# Patient Record
Sex: Female | Born: 1960 | Race: White | Hispanic: No | Smoking: Never smoker
Health system: Southern US, Community
[De-identification: ages and names within clinical notes are randomized; demographics above are authoritative.]

## PROBLEM LIST (undated history)

## (undated) DIAGNOSIS — R7303 Prediabetes: Secondary | ICD-10-CM

## (undated) HISTORY — PX: TUBAL LIGATION: SHX77

## (undated) HISTORY — PX: ABDOMINAL HYSTERECTOMY: SHX81

## (undated) HISTORY — DX: Prediabetes: R73.03

## (undated) HISTORY — PX: COLONOSCOPY: SHX174

## (undated) HISTORY — PX: FINGER SURGERY: SHX640

---

## 2000-07-27 ENCOUNTER — Other Ambulatory Visit: Admission: RE | Admit: 2000-07-27 | Discharge: 2000-07-27 | Payer: Self-pay | Admitting: Obstetrics and Gynecology

## 2000-08-03 ENCOUNTER — Other Ambulatory Visit: Admission: RE | Admit: 2000-08-03 | Discharge: 2000-08-03 | Payer: Self-pay | Admitting: Obstetrics and Gynecology

## 2000-08-03 ENCOUNTER — Encounter (INDEPENDENT_AMBULATORY_CARE_PROVIDER_SITE_OTHER): Payer: Self-pay | Admitting: Specialist

## 2000-09-03 ENCOUNTER — Encounter (INDEPENDENT_AMBULATORY_CARE_PROVIDER_SITE_OTHER): Payer: Self-pay | Admitting: Specialist

## 2000-09-03 ENCOUNTER — Ambulatory Visit (HOSPITAL_COMMUNITY): Admission: RE | Admit: 2000-09-03 | Discharge: 2000-09-03 | Payer: Self-pay | Admitting: Obstetrics and Gynecology

## 2000-10-21 ENCOUNTER — Encounter (INDEPENDENT_AMBULATORY_CARE_PROVIDER_SITE_OTHER): Payer: Self-pay | Admitting: Specialist

## 2000-10-21 ENCOUNTER — Inpatient Hospital Stay (HOSPITAL_COMMUNITY): Admission: RE | Admit: 2000-10-21 | Discharge: 2000-10-23 | Payer: Self-pay | Admitting: Obstetrics and Gynecology

## 2000-11-30 ENCOUNTER — Other Ambulatory Visit: Admission: RE | Admit: 2000-11-30 | Discharge: 2000-11-30 | Payer: Self-pay | Admitting: Obstetrics and Gynecology

## 2001-03-01 ENCOUNTER — Other Ambulatory Visit: Admission: RE | Admit: 2001-03-01 | Discharge: 2001-03-01 | Payer: Self-pay | Admitting: Internal Medicine

## 2001-05-30 ENCOUNTER — Other Ambulatory Visit: Admission: RE | Admit: 2001-05-30 | Discharge: 2001-05-30 | Payer: Self-pay | Admitting: Obstetrics and Gynecology

## 2001-08-29 ENCOUNTER — Other Ambulatory Visit: Admission: RE | Admit: 2001-08-29 | Discharge: 2001-08-29 | Payer: Self-pay | Admitting: Internal Medicine

## 2001-11-29 ENCOUNTER — Other Ambulatory Visit: Admission: RE | Admit: 2001-11-29 | Discharge: 2001-11-29 | Payer: Self-pay | Admitting: Obstetrics and Gynecology

## 2004-07-17 ENCOUNTER — Encounter: Admission: RE | Admit: 2004-07-17 | Discharge: 2004-07-17 | Payer: Self-pay | Admitting: Obstetrics and Gynecology

## 2010-04-08 ENCOUNTER — Encounter: Admission: RE | Admit: 2010-04-08 | Discharge: 2010-04-08 | Payer: Self-pay | Admitting: Family Medicine

## 2010-11-21 NOTE — Op Note (Signed)
[facility]  Patient:    Joanna Santos, Joanna Santos [facility]                     MRN: [id number] Proc. Date: 10/21/00 Adm. 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  OPERATION PERFORMED:  Vaginal hysterectomy accomplished only by coring technique.  SURGEON:  Dr. Ambrose Mantle.  ASSISTANT:  Dr. Jackelyn Knife.  ANESTHESIA:  General.  DESCRIPTION OF PROCEDURE:  The patient was brought to the operating room and placed under satisfactory general anesthesia. She was placed in lithotomy position. The exam revealed the uterus to be not very mobile. I did feel like I could feel the cul-de-sac. The uterus was hard to feel. It was probably mid plane and I thought it was normal size. The adnexa seemed clear. The vulva, vagina, perineum and urethra were prepped with Betadine solution. A Foley catheter was inserted to straight drain and the area was draped as a sterile field. The cervix did not move much but it was grasped with Allen clamps. The cervicovaginal junction was injected with a dilute solution of Neo-Synephrine and then a circumferential incision was made around the cervix. I tried to push the anterior mucosa forward. I could not identify the cul-de-sac with certainty so I took a couple of bites extraperitoneal and then did a rectal exam to try to ensure that the rectum was not involved with what I was going to enter in the cul-de-sac. I then entered the cul-de-sac without injuring the rectum. Clamps were placed on the uterosacral ligaments bilaterally. They were cut, suture ligated and held. The paracervical and parametrial tissues were then clamped, cut and suture ligated in a step wise fashion. The  uterus still did not move much. I was able to enter the peritoneal cavity anteriorly, continued to go up the sides of the uterus, tried a couple of times to invert the uterus through the incision and the cul-de-sac but it would not move. Finally I elected to core the uterus and after coring the uterus, I was able to approach both upper pedicles. At this point, however, it was apparent that the clamps across what I had assumed were the upper pedicles on the right side basically were holding no tissue. There was some bleeding. I went ahead and removed the uterus by transecting the left upper pedicle and doubly suture ligating it. I searched for the bleeding points on the right and they seemed to be bleeding points just next to the ovary. I was able to control the bleeding with figure-of-eight sutures of #0 Vicryl. I then held on to the sutures on the upper pedicles on the right as well as the left. It took me a good while to establish complete hemostasis along the broad ligaments. I then filled the bladder with methylene blue stained fluid, there was no leakage. I reperitonealized the pelvis using a running suture of #1 Vicryl through the anterior peritoneum, the upper pedicles and lower pedicles bilaterally as well as the posterior cul-de-sac. I then tied this suture down after confirming that hemostasis intraperitoneal was complete. I then reapproximated the vaginal mucosa with interrupted figure-of-eight suture of #0 Vicryl.  Hemostasis appeared to be complete. I would say that this is one of the most difficult vaginal hysterectomies that I have ever performed not only requiring the coring technique but getting proper lighting into the deep vagina as well as being able to place the sutures so high in the vagina was a continual challenge. Blood loss was thought to be about 400 cc. Sponge and needle counts were correct and the patient was returned to recovery in satisfactory condition. DD:   10/21/00 TD:  10/21/00 Job: [id number]

## 2010-11-21 NOTE — Op Note (Signed)
[facility]  Patient:    Joanna Santos, Joanna Santos                       MRN: [id number] Proc. Date: 09/03/00 Adm. Date:  [id number] Disc. Date: [id number] Attending:  Malon Kindle                           Operative Report  PREOPERATIVE DIAGNOSIS:  Severe cervical dysplasia with unsatisfactory colposcopy.  POSTOPERATIVE DIAGNOSIS:  Severe cervical dysplasia with unsatisfactory colposcopy.  OPERATION:  Dilation and curettage laser cone.  OPERATOR:  Malachi Pro. Ambrose Mantle, M.D.  ANESTHESIA:  General.  DESCRIPTION OF PROCEDURE:  The patient was brought to the operating room and placed under satisfactory general anesthesia, placed in lithotomy position. Examination revealed the uterus to be anterior and normal size.  The adnexa were clean.  The patient was on her menstrual period.  The vulva was prepped with Betadine solution and draped as a sterile field.  Wet towels were placed around the vulva.  The Graves speculum was inserted.  It was ebonized.  The cervix was identified, was painted with 5% acetic acid.   There was white epithelium with some vessels at approximately 10-12 oclock.  I could not see the squamocolumnar junction well, but there was some mucus present which might have inhibited the visualization.  I outlined a cone bed and then injected the cervix with a dilute solution of Pitressin.  I used about 15 cc of a solution mixed by using 10 units of Pitressin and 120 cc of saline.  I then did the conization with a superpulse at maximum wattage of 13.  I cut off the top of the cone with sharp scissors, obtained hemostasis with a defocused beam at 10 watts, then did a fractional D&C and reapproximated the cervix with four suture of 0 chromic catgut.  The patient seemed to tolerate the procedure well.  Blood loss was less than 5 cc.  I did resound the uterus at the end of the procedure to confirm the patency of the cervical canal, and I placed  a little bit of Munsell solution in the cervical canal. DD:  09/03/00 TD:  09/05/00 Job: [id number]

## 2010-11-21 NOTE — Discharge Summary (Signed)
[facility]  Patient:    Joanna Santos, Joanna Santos [facility]                     MRN: [id number] Adm. Date:  [id number] Disc. Date: [id number] Attending:  Malon Kindle                           Discharge Summary  HISTORY OF PRESENT ILLNESS:  This is a 50 year old white female who had stage IA 1 squamous cell carcinoma of the cervix on a recent conization.  She was admitted for vaginal hysterectomy.  HOSPITAL COURSE:  The patient underwent a vaginal hysterectomy by coring technique by Dr. Ambrose Mantle with Dr. Jackelyn Knife assisting under general anesthesia on October 21, 2000.  Blood loss about 400 cc.  One pack was left.  The pack was removed on the first postop day.  The patient did quite well, and she was discharged on the second postop day.  Her abdomen was soft, nontender.  She was passing flatus, tolerating a diet.  LABORATORY DATA:  Initial hemoglobin was 14.4, hematocrit 41.5, white count 6100, platelet count 297,000.  Comprehensive metabolic profile was normal. Urinalysis was negative.  Follow-up hematocrit was 37.7.  Path report showed uterus and cervix.  Cervix showed findings consistent with previous conization, no residual dysplasia with microinvasive carcinoma was identified, benign secretory endometrium, no hyperplasia or malignancy, adenomyosis, leiomyomata with focal increased mitotic figures.  One of the leiomyoma showed increased numbers of mitotic activity with a mitotic count ranging from 1 to 4 per 10 high-power fields.  This finding is not associated with significant atypia and infiltrative growth pattern or coagulative necrosis, and the features are consistent with a mitotically active leiomyoma.  Additional sections of the leiomyomas will be submitted and reported as an addendum.  The addendum has not been reported at this point.  FINAL DIAGNOSES: 1. Squamous cell carcinoma of the cervix, stage IA 1, with no residual    dysplasia or carcinoma  identified in the hysterectomy specimen. 2. Adenomyosis. 3. Mitotically active leiomyoma.  Further evaluation pending.  OPERATION PERFORMED:  Vaginal hysterectomy by coring technique.  FINAL CONDITION:  Improved.  DISCHARGE INSTRUCTIONS:  Instructions include our regular discharge instructions, no vaginal entrance, no heavy lifting or strenuous activity. Call with temperature elevation greater than 100.4 degrees, call with any heavy vaginal bleeding, and return to the office in 10 to 14 days for follow-up examination.  DISCHARGE MEDICATIONS:  The patient is also given a prescription for [facility], #20 tablets, one q.4-6h. p.r.n. pain. DD:  10/23/00 TD:  10/25/00 Job: [id number]

## 2010-11-21 NOTE — H&P (Signed)
[facility]  Patient:    Joanna Santos, Joanna Santos [facility]                     MRN: [id number] Adm. Date:  [id number] Attending:  Malon Kindle                         History and Physical  PRESENT ILLNESS:  This is a 50 year old white married female, para 2-0-3-2, who is admitted to the hospital for hysterectomy because of stage IA1 squamous cell carcinoma of the cervix.  The patient has had regular periods.  She underwent D&C/conization on September 03, 2000 because of moderately and focally severe squamous dysplasia on cervical biopsy, with also squamous dysplasia in endocervical curetting, and the squamocolumnar junction was not seen circumferentially.  At the time of the conization, September 03, 2000, the patient was noted to have severe squamous dysplasia with focal microinvasive well-differentiated squamous cell carcinoma.  The margins were not involved. In the 9 to 12 oclock quadrant of the cone, there was a microscopic focus of well-differentiated invasive squamous cell carcinoma which invaded the stroma to a depth of 1 mm.  There was no vascular space involvement. Dr. Clovis Pu Zirker reviewed the slides with ______ and agreed with the diagnosis.  Patient was informed and was advised to consider hysterectomy and she chose to proceed with hysterectomy.  ALLERGIES:  Her past medical history reveals no known allergies.  MEDICATIONS:  She is on no medications.  OPERATIONS:  Tubal ligation, D&C, left pointer finger surgery, D&C/conization.  PAST MEDICAL HISTORY:  She has had the usual childhood diseases.  No heart ailments.  SOCIAL HISTORY:  She drinks socially.  She does not smoke.  REVIEW OF SYSTEMS:  Noncontributory.  FAMILY HISTORY:  Mother at 30 with high blood pressure, diabetes and high cholesterol.  Father 59, living and well.  Two sisters, one with diabetes. Three brothers, one with arthritis.  PHYSICAL EXAMINATION:  GENERAL:  Physical examination  reveals a well-developed, somewhat overweight white female weighing 190 pounds.  VITAL SIGNS:  Blood pressure 140/90 and pulse is 70.  HEENT:  Head, eyes, ears, nose and throat reveal no cranial abnormalities. The extraocular movements are intact.  Nose and pharynx are clear.  LUNGS:  Lungs are clear to P&A.  BREASTS:  Breasts soft without masses.  HEART:  Heart normal size and sound.  NECK:  Thyroid is normal.  ABDOMEN:  The abdomen is soft and flat and nontender.  Liver, spleen and kidneys are not felt.  There are no masses in the abdomen.  PELVIC:  The vulva and vagina on examination show normal external genitalia, a clean vagina with a clean well-healed cervix, uterus not terribly movable but it was felt that it could be removed vaginally.  It was midplane, normal size. The adnexa were free of masses.  ADMITTING IMPRESSION:  Squamous cell carcinoma of the cervix, well-differentiated stage IA1.  The patient is admitted for a vaginal hysterectomy.  If the hysterectomy cannot be done vaginally, then it will be done abdominally.  The patient has been informed of the risks of surgery including, but not limited to, heart attack, stroke, pulmonary embolus, wound disruption, hemorrhage with the need for reoperation and/or transfusion, fistula formation, wound disruption, nerve injury, intestinal obstruction. She also has been informed of the possible need for transfusions and reoperation.  She understands and agrees to proceed. DD:  10/20/00 TD:  10/21/00 Job: [id number]

## 2011-10-21 ENCOUNTER — Encounter (HOSPITAL_COMMUNITY): Payer: Self-pay | Admitting: *Deleted

## 2011-10-21 ENCOUNTER — Emergency Department (INDEPENDENT_AMBULATORY_CARE_PROVIDER_SITE_OTHER)
Admission: EM | Admit: 2011-10-21 | Discharge: 2011-10-21 | Disposition: A | Discharge status: Home / Self Care | Payer: 59 | Source: Home / Self Care | Attending: Family Medicine | Admitting: Family Medicine

## 2011-10-21 DIAGNOSIS — L259 Unspecified contact dermatitis, unspecified cause: Secondary | ICD-10-CM

## 2011-10-21 MED ORDER — PREDNISONE 20 MG PO TABS: ORAL_TABLET | ORAL | Status: AC

## 2011-10-21 MED ORDER — CEPHALEXIN 500 MG PO CAPS: 500.0000 mg | ORAL_CAPSULE | Freq: Two times a day (BID) | ORAL | Status: AC

## 2011-10-21 MED ORDER — TRIAMCINOLONE ACETONIDE 0.5 % EX OINT: TOPICAL_OINTMENT | Freq: Two times a day (BID) | CUTANEOUS | Status: AC

## 2011-10-21 MED ORDER — HYDROXYZINE HCL 50 MG PO TABS: 50.0000 mg | ORAL_TABLET | Freq: Three times a day (TID) | ORAL | Status: AC | PRN

## 2011-10-21 NOTE — ED Notes (Signed)
Pt  Has  sm  Sore  On r  Forearm  With  Redness  And  Some  Swelling  Which  Started  Out as  A  Small  Bump that she  Squeezed   And  It  Got  Worse  -  She  Also    Has  Red  Raised   Rash top  abd  X  5  Days  -  She  Is     Sitting  Upright on exam table  And  Is  In no  Severe  Distress

## 2011-10-21 NOTE — Discharge Instructions (Signed)
Avoid re-exposure. You need to use long sleeves and gloves when gardening. Use/take the prescribed medications as instructed. Can fill the cephalexin prescription (antibiotic) if worsening redness or purulent drainage on the patch in your right forearm.  Return if new or worsening symptoms despite following treatment.

## 2011-10-21 NOTE — ED Provider Notes (Signed)
History     CSN: [id number]  Arrival date & time 10/21/11  1625   First MD Initiated Contact with Patient 10/21/11 1710      Chief Complaint  Patient presents with  . Rash    (Consider location/radiation/quality/duration/timing/severity/associated sxs/prior treatment) HPI Comments: 51 year old female with no significant past medical history here complaining of a pruriginous rash in 4 arms and anterior torso for 5 days. Reports rash started today after working in her garden. Had an area in the right forearm the head of the blister the patient squeezed. No fever or or chills. No cough, wheezing or difficulty breathing. Otherwise doing well.   History reviewed. No pertinent past medical history.  Past Surgical History  Procedure Date  . Abdominal hysterectomy   . Colonoscopy     No family history on file.  History  Substance Use Topics  . Smoking status: Not on file  . Smokeless tobacco: Not on file  . Alcohol Use: Yes    OB History    Grav Para Term Preterm Abortions TAB SAB Ect Mult Living                  Review of Systems  Constitutional: Negative for fever, chills and appetite change.  HENT: Negative for congestion and sore throat.   Respiratory: Negative for cough, shortness of breath and wheezing.   Gastrointestinal: Negative for nausea and vomiting.  Skin: Positive for rash.    Allergies  Review of patient's allergies indicates no known allergies.  Home Medications   Current Outpatient Rx  Name Route Sig Dispense Refill  . CEPHALEXIN 500 MG PO CAPS Oral Take 1 capsule (500 mg total) by mouth 2 (two) times daily. 14 capsule 0  . HYDROXYZINE HCL 50 MG PO TABS Oral Take 1 tablet (50 mg total) by mouth every 8 (eight) hours as needed for itching. 30 tablet 0  . PREDNISONE 20 MG PO TABS  2 tabs po daily for 5 days 10 tablet no  . TRIAMCINOLONE ACETONIDE 0.5 % EX OINT Topical Apply topically 2 (two) times daily. 30 g 0    BP 129/75  Pulse 62  Temp(Src)  98.9 F (37.2 C) (Oral)  Resp 16  SpO2 100%  Physical Exam  Nursing note and vitals reviewed. Constitutional: She is oriented to person, place, and time. She appears well-developed and well-nourished. No distress.  HENT:  Head: Normocephalic and atraumatic.  Right Ear: External ear normal.  Left Ear: External ear normal.  Mouth/Throat: Oropharynx is clear and moist.  Eyes: Conjunctivae are normal.  Neck: Neck supple.  Cardiovascular: Normal heart sounds.   Pulmonary/Chest: Effort normal and breath sounds normal. No respiratory distress. She has no wheezes.  Lymphadenopathy:    She has no cervical adenopathy.  Neurological: She is alert and oriented to person, place, and time.  Skin:       papulo vesicular rash in fore arms and anterior torso, pruriginous. More confuent in fore arms there is an area in right inner fore arm with a patch that has a ruptured central blister with amber trasudate and surrounding erythema of about 1 cm concentric flat no indurations or fluctuations     ED Course  Procedures (including critical care time)  Labs Reviewed - No data to display No results found.   1. Contact dermatitis       MDM  Likely plant related contact dermatitis. Prescribed prednisone for 5 days and continue steroid ointment onto resolution. Vistaril 50 mg 3 times a  day when necessary. As per her request gave a prescription to hold for Keflex in case rash in right arm gets worse or presents purulent drainage. Although currently does not look infected.        Sharin Grave, MD 10/23/11 1135

## 2012-04-19 ENCOUNTER — Other Ambulatory Visit: Payer: Self-pay | Admitting: Nurse Practitioner

## 2012-04-19 DIAGNOSIS — Z1231 Encounter for screening mammogram for malignant neoplasm of breast: Secondary | ICD-10-CM

## 2012-05-17 ENCOUNTER — Ambulatory Visit
Admission: RE | Admit: 2012-05-17 | Discharge: 2012-05-17 | Disposition: A | Discharge status: Home / Self Care | Payer: 59 | Source: Ambulatory Visit | Attending: Nurse Practitioner | Admitting: Nurse Practitioner

## 2012-05-17 DIAGNOSIS — Z1231 Encounter for screening mammogram for malignant neoplasm of breast: Secondary | ICD-10-CM

## 2013-04-13 ENCOUNTER — Other Ambulatory Visit: Payer: Self-pay

## 2013-04-13 DIAGNOSIS — Z1231 Encounter for screening mammogram for malignant neoplasm of breast: Secondary | ICD-10-CM

## 2013-05-18 ENCOUNTER — Ambulatory Visit
Admission: RE | Admit: 2013-05-18 | Discharge: 2013-05-18 | Disposition: A | Discharge status: Home / Self Care | Payer: 59 | Source: Ambulatory Visit

## 2013-05-18 DIAGNOSIS — Z1231 Encounter for screening mammogram for malignant neoplasm of breast: Secondary | ICD-10-CM

## 2014-06-11 ENCOUNTER — Other Ambulatory Visit: Payer: Self-pay

## 2014-06-11 DIAGNOSIS — Z1231 Encounter for screening mammogram for malignant neoplasm of breast: Secondary | ICD-10-CM

## 2014-06-21 ENCOUNTER — Ambulatory Visit
Admission: RE | Admit: 2014-06-21 | Discharge: 2014-06-21 | Disposition: A | Discharge status: Home / Self Care | Payer: 59 | Source: Ambulatory Visit

## 2014-06-21 DIAGNOSIS — Z1231 Encounter for screening mammogram for malignant neoplasm of breast: Secondary | ICD-10-CM

## 2015-06-03 ENCOUNTER — Other Ambulatory Visit: Payer: Self-pay

## 2015-06-03 DIAGNOSIS — Z1231 Encounter for screening mammogram for malignant neoplasm of breast: Secondary | ICD-10-CM

## 2015-07-02 ENCOUNTER — Ambulatory Visit: Payer: Self-pay

## 2015-07-16 ENCOUNTER — Ambulatory Visit: Payer: Self-pay

## 2015-07-23 ENCOUNTER — Ambulatory Visit
Admission: RE | Admit: 2015-07-23 | Discharge: 2015-07-23 | Disposition: A | Discharge status: Home / Self Care | Payer: Commercial Managed Care - HMO | Source: Ambulatory Visit

## 2015-07-23 DIAGNOSIS — Z1231 Encounter for screening mammogram for malignant neoplasm of breast: Secondary | ICD-10-CM

## 2016-09-08 ENCOUNTER — Other Ambulatory Visit: Payer: Self-pay | Admitting: Nurse Practitioner

## 2016-09-08 DIAGNOSIS — Z1231 Encounter for screening mammogram for malignant neoplasm of breast: Secondary | ICD-10-CM

## 2016-09-28 ENCOUNTER — Ambulatory Visit: Payer: Commercial Managed Care - HMO

## 2016-09-30 ENCOUNTER — Ambulatory Visit
Admission: RE | Admit: 2016-09-30 | Discharge: 2016-09-30 | Disposition: A | Discharge status: Home / Self Care | Payer: 59 | Source: Ambulatory Visit | Attending: Nurse Practitioner | Admitting: Nurse Practitioner

## 2016-09-30 DIAGNOSIS — Z1231 Encounter for screening mammogram for malignant neoplasm of breast: Secondary | ICD-10-CM

## 2017-11-17 ENCOUNTER — Other Ambulatory Visit: Payer: Self-pay | Admitting: Obstetrics and Gynecology

## 2017-11-17 DIAGNOSIS — Z1231 Encounter for screening mammogram for malignant neoplasm of breast: Secondary | ICD-10-CM

## 2017-12-09 ENCOUNTER — Ambulatory Visit
Admission: RE | Admit: 2017-12-09 | Discharge: 2017-12-09 | Disposition: A | Discharge status: Home / Self Care | Payer: 59 | Source: Ambulatory Visit | Attending: Obstetrics and Gynecology | Admitting: Obstetrics and Gynecology

## 2017-12-09 DIAGNOSIS — Z1231 Encounter for screening mammogram for malignant neoplasm of breast: Secondary | ICD-10-CM

## 2017-12-10 ENCOUNTER — Ambulatory Visit: Payer: 59

## 2019-06-06 LAB — HM COLONOSCOPY

## 2019-10-18 ENCOUNTER — Other Ambulatory Visit: Payer: Self-pay | Admitting: Obstetrics and Gynecology

## 2019-10-18 DIAGNOSIS — R928 Other abnormal and inconclusive findings on diagnostic imaging of breast: Secondary | ICD-10-CM

## 2019-10-23 ENCOUNTER — Other Ambulatory Visit: Payer: 59

## 2019-10-23 ENCOUNTER — Ambulatory Visit
Admission: RE | Admit: 2019-10-23 | Discharge: 2019-10-23 | Disposition: A | Discharge status: Home / Self Care | Payer: 59 | Source: Ambulatory Visit | Attending: Obstetrics and Gynecology | Admitting: Obstetrics and Gynecology

## 2019-10-23 ENCOUNTER — Ambulatory Visit
Admission: RE | Admit: 2019-10-23 | Discharge: 2019-10-23 | Disposition: A | Discharge status: Home / Self Care | Payer: No Typology Code available for payment source | Source: Ambulatory Visit | Attending: Obstetrics and Gynecology | Admitting: Obstetrics and Gynecology

## 2019-10-23 ENCOUNTER — Other Ambulatory Visit: Payer: Self-pay

## 2019-10-23 DIAGNOSIS — R928 Other abnormal and inconclusive findings on diagnostic imaging of breast: Secondary | ICD-10-CM

## 2020-09-04 IMAGING — US US BREAST*R* LIMITED INC AXILLA
1 series · 8 of 8 positions shown · non-contrast
Comparison: 10/09/2019 and earlier

CLINICAL DATA: Patient returns after screening study for evaluation
of possible asymmetry in the RIGHT breast.

EXAM:
DIGITAL DIAGNOSTIC RIGHT MAMMOGRAM WITH CAD AND TOMO
ULTRASOUND RIGHT BREAST

[Series 1: us breast*right* limited inc axilla · 0.07mm/px · 8 of 8 slices shown]
[im 1/8]
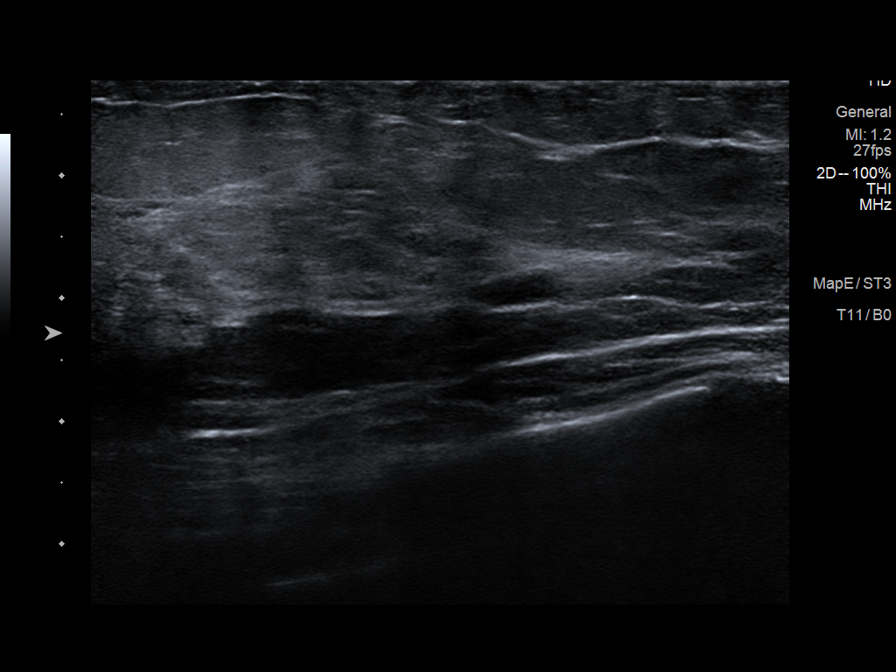
[im 2/8]
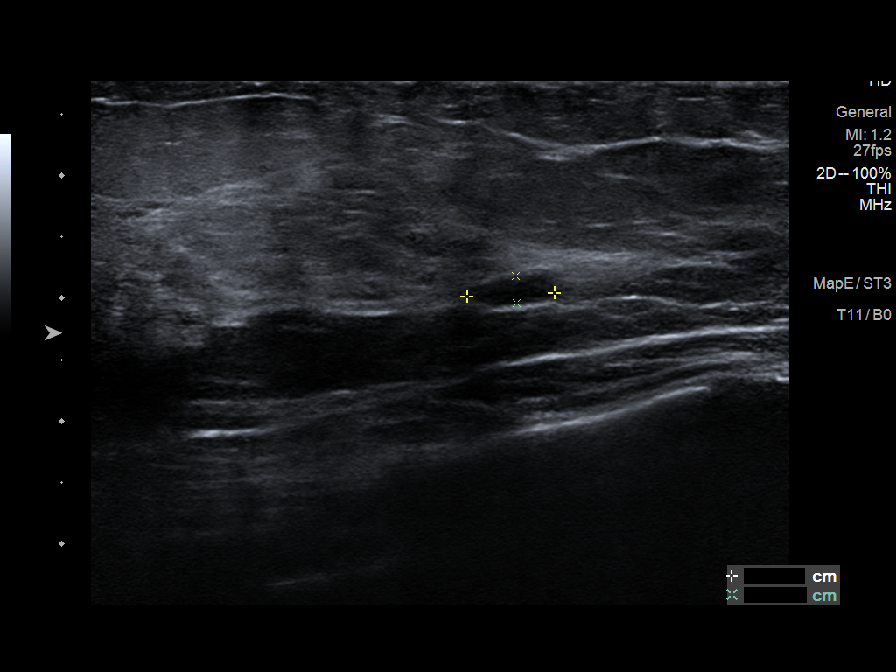
[im 3/8]
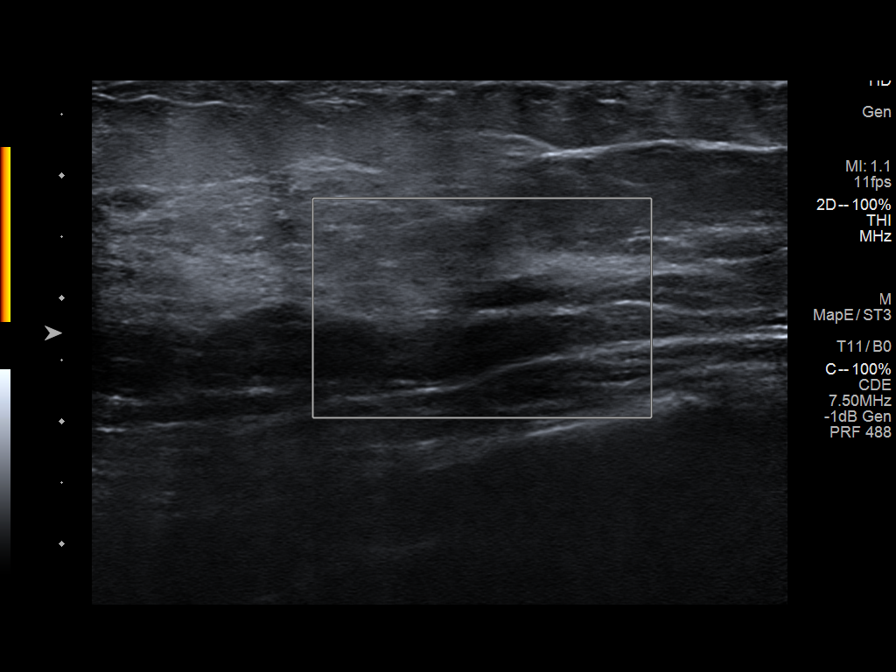
[im 4/8]
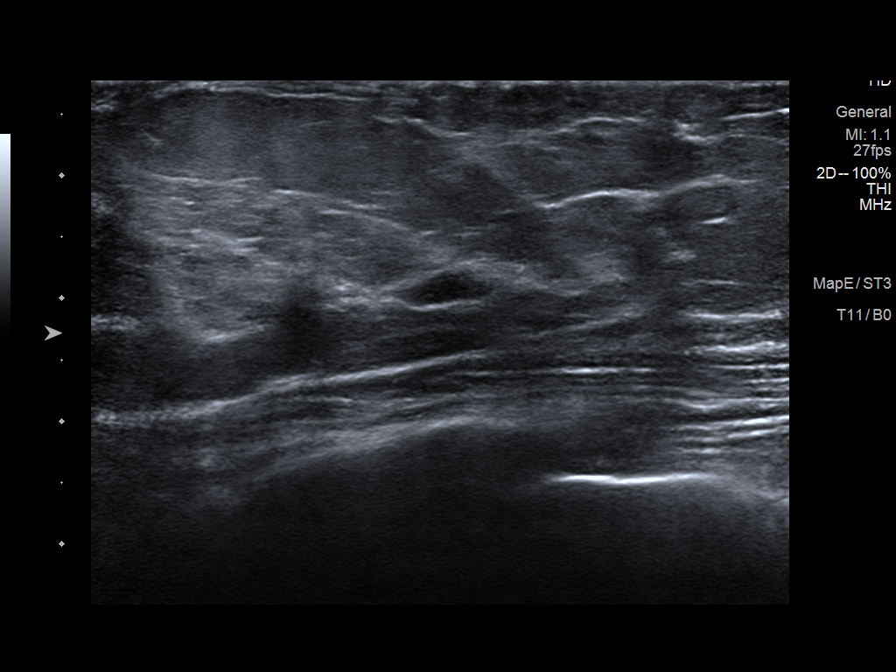
[im 5/8]
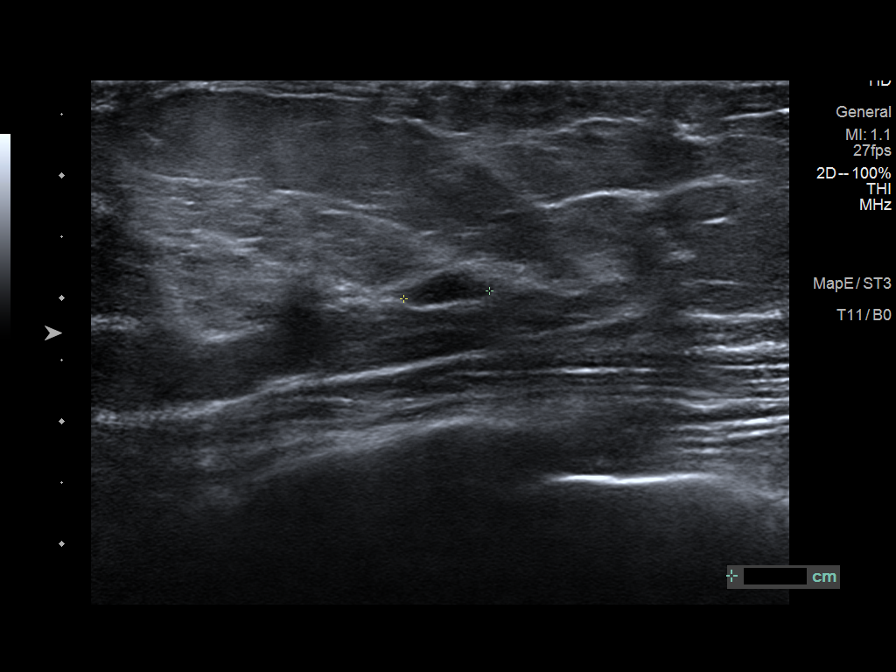
[im 6/8]
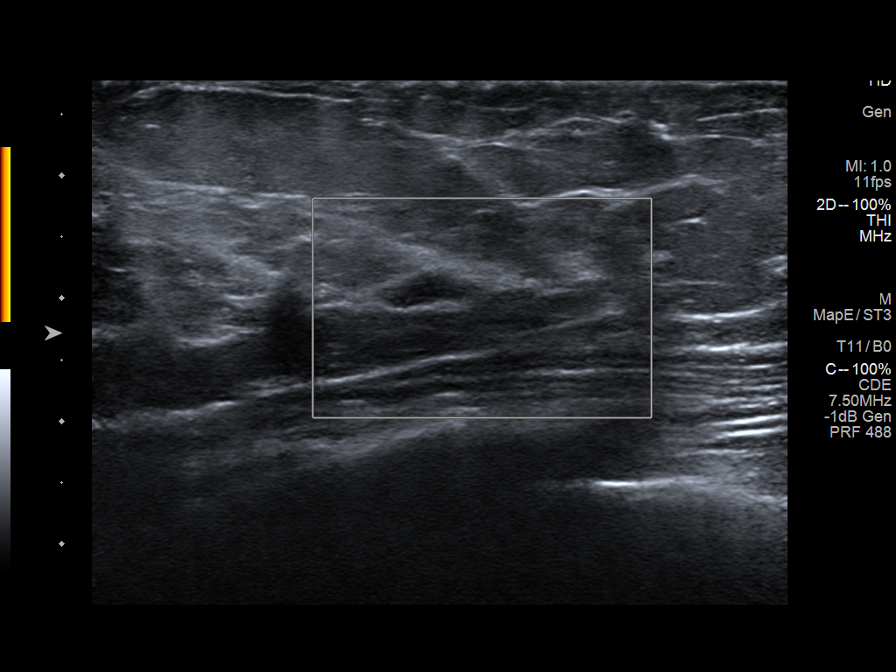
[im 7/8]
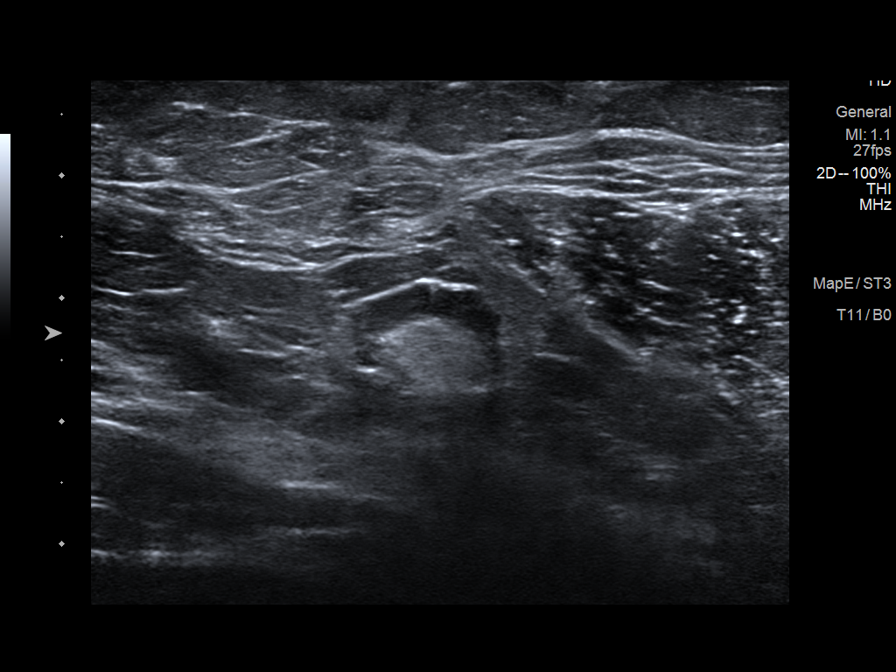
[im 8/8]
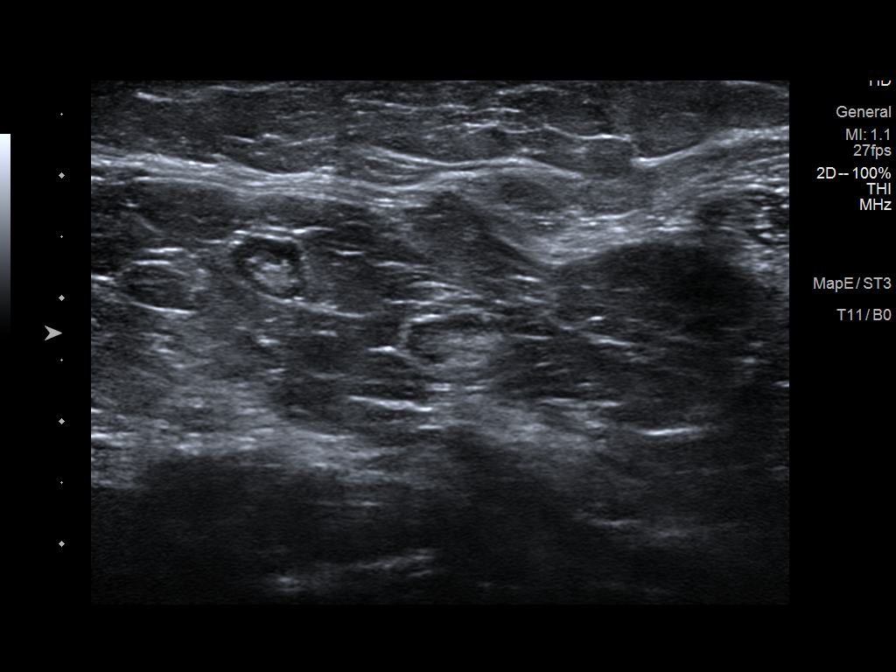

[8 of 8 positions shown; findings below may reference images not displayed]

ACR Breast Density Category c: The breast tissue is heterogeneously
dense, which may obscure small masses.
FINDINGS: Additional 2-D and 3-D images are performed. These views confirm
presence of an oval density in the posteromedial aspect of the RIGHT
breast, best seen on craniocaudal projections.

Mammographic images were processed with CAD.

Targeted ultrasound is performed, showing a simple cyst in the
posterior 12 o'clock location of the RIGHT breast 4 centimeters from
the nipple which measures 0.7 x 0.2 x 0.7 centimeters.
IMPRESSION: Benign cyst in the RIGHT breast. No mammographic or ultrasound
evidence for malignancy.

RECOMMENDATION:
Screening mammogram in one year.(Code:YF-T-10S)

I have discussed the findings and recommendations with the patient.
If applicable, a reminder letter will be sent to the patient
regarding the next appointment.

BI-RADS CATEGORY  2: Benign.

## 2020-09-04 IMAGING — MG MM DIGITAL DIAGNOSTIC UNILAT*R* W/ TOMO W/ CAD
4 series · 4 of 12 positions shown · non-contrast
Comparison: 10/09/2019 and earlier

CLINICAL DATA: Patient returns after screening study for evaluation
of possible asymmetry in the RIGHT breast.

EXAM:
DIGITAL DIAGNOSTIC RIGHT MAMMOGRAM WITH CAD AND TOMO
ULTRASOUND RIGHT BREAST

[R CC synth-2D]
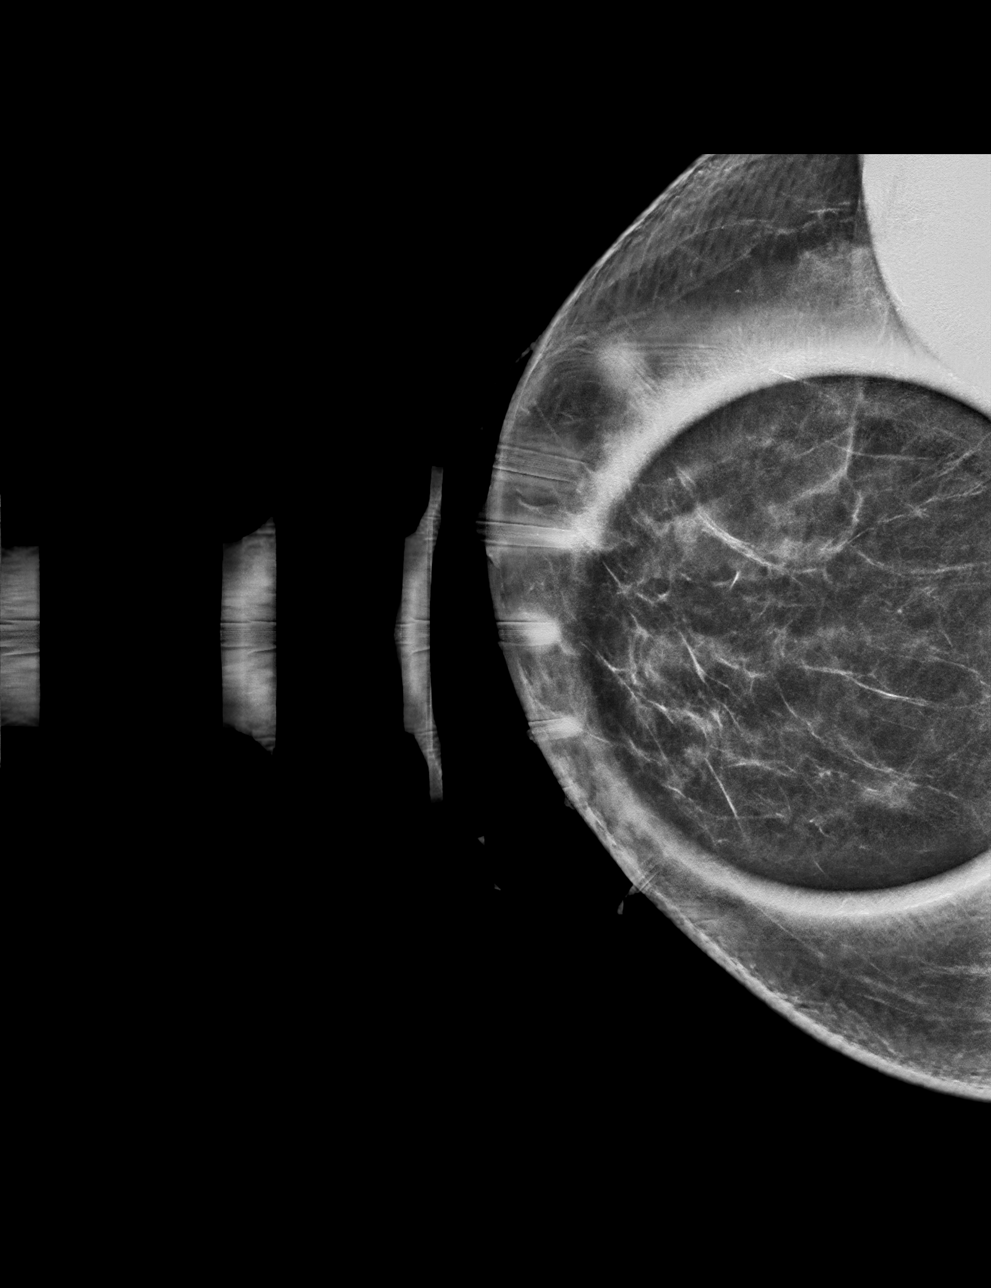

[R ML synth-2D]
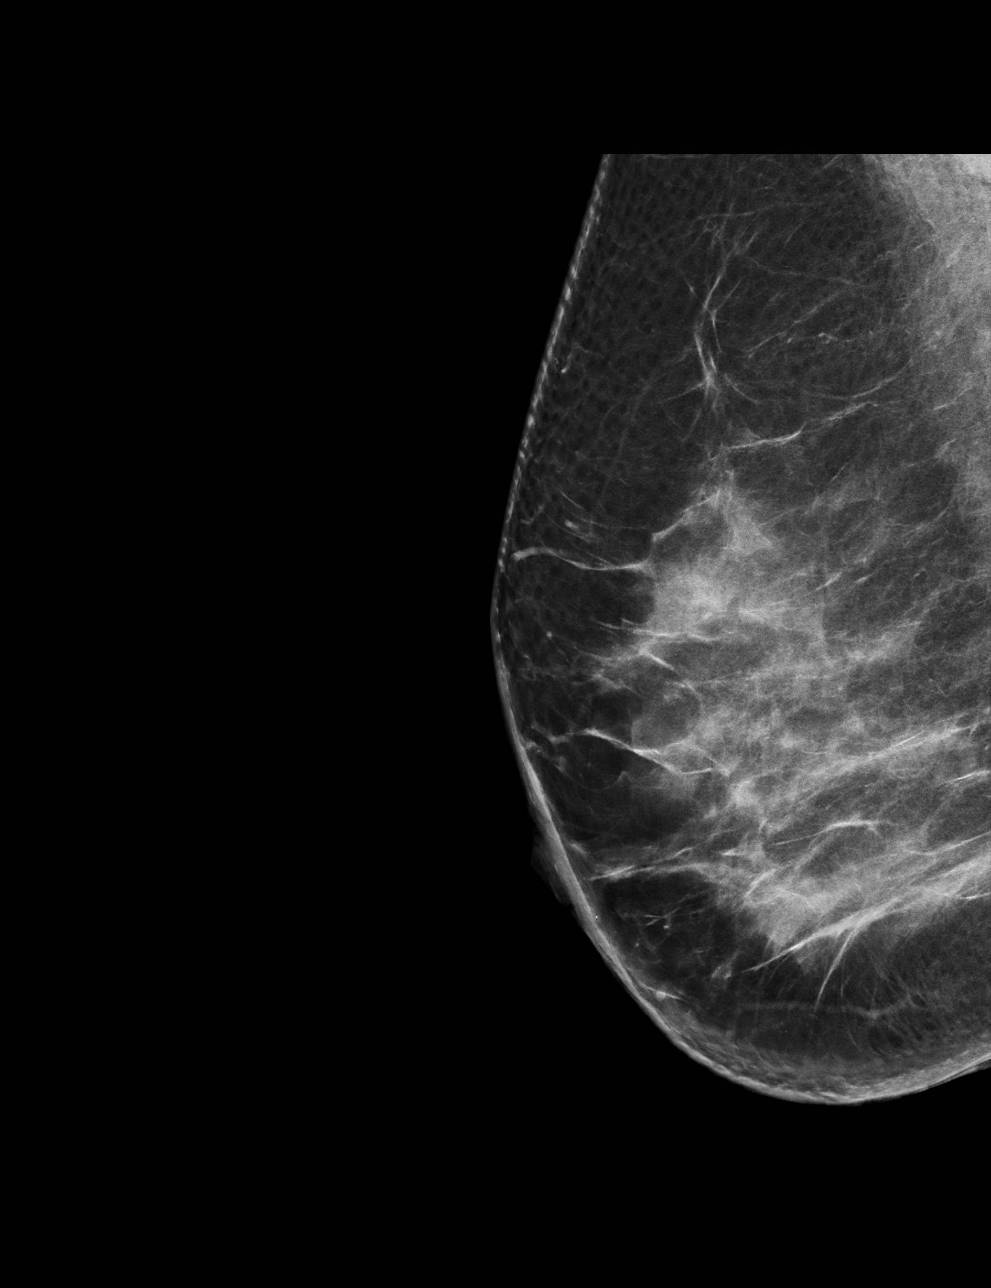

[R CC tomo · tomo slice 37/72.0]
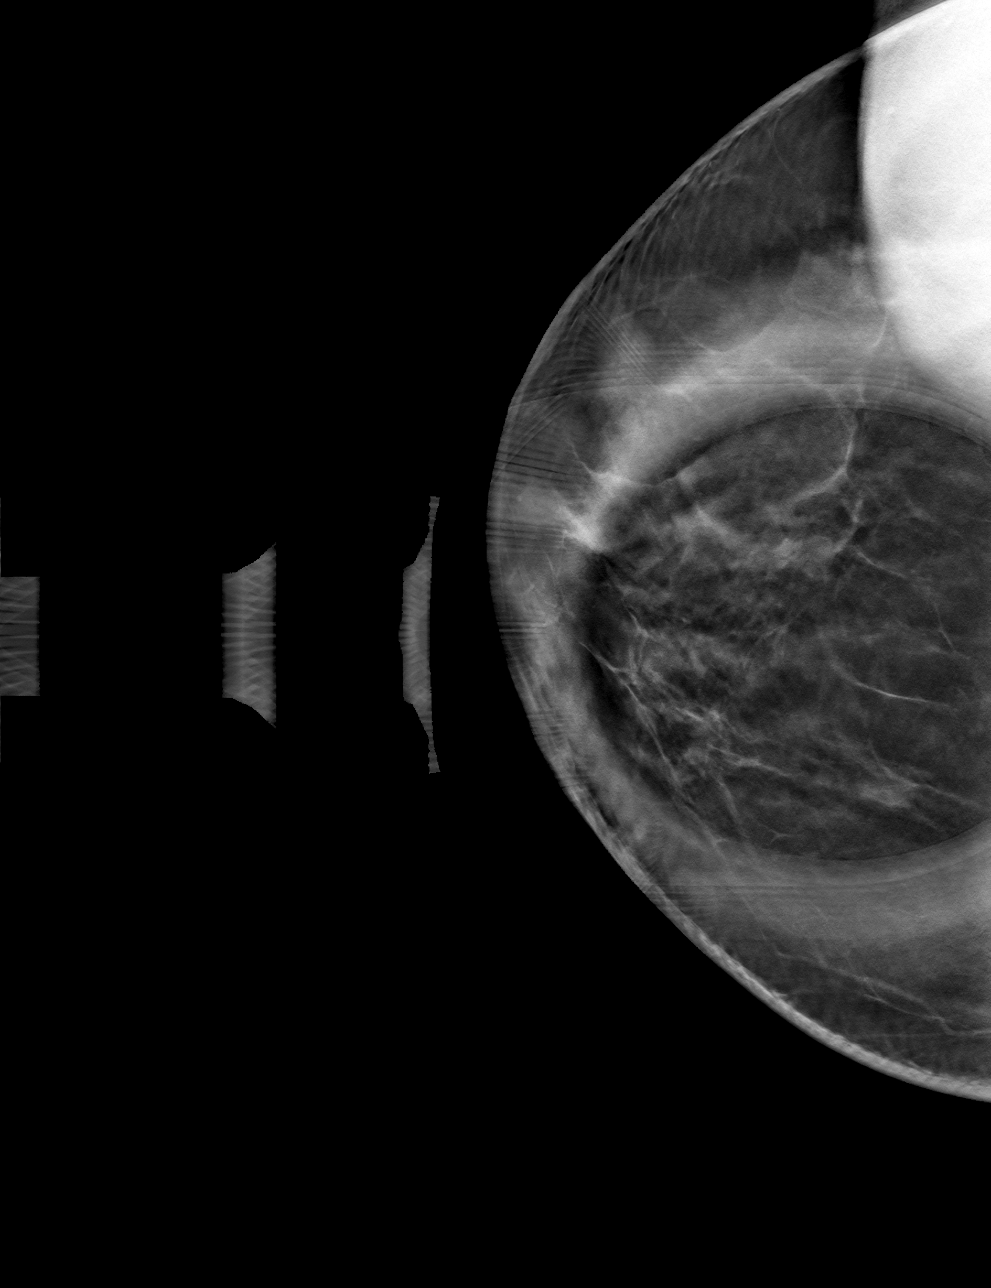

[R ML tomo · tomo slice 41/80.0]
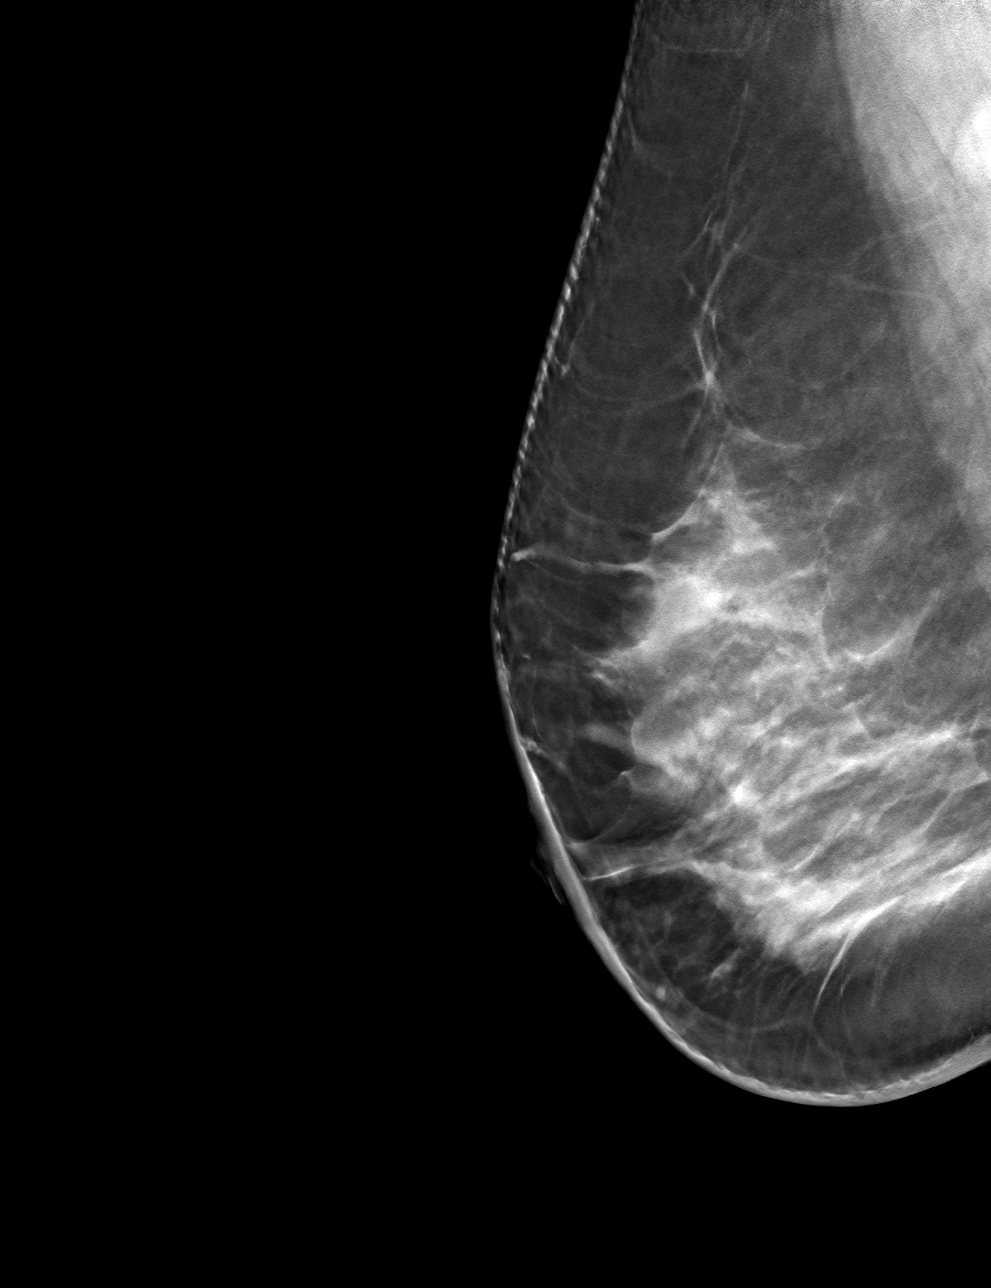

[4 of 12 positions shown; findings below may reference images not displayed]

ACR Breast Density Category c: The breast tissue is heterogeneously
dense, which may obscure small masses.
FINDINGS: Additional 2-D and 3-D images are performed. These views confirm
presence of an oval density in the posteromedial aspect of the RIGHT
breast, best seen on craniocaudal projections.

Mammographic images were processed with CAD.

Targeted ultrasound is performed, showing a simple cyst in the
posterior 12 o'clock location of the RIGHT breast 4 centimeters from
the nipple which measures 0.7 x 0.2 x 0.7 centimeters.
IMPRESSION: Benign cyst in the RIGHT breast. No mammographic or ultrasound
evidence for malignancy.

RECOMMENDATION:
Screening mammogram in one year.(Code:YF-T-10S)

I have discussed the findings and recommendations with the patient.
If applicable, a reminder letter will be sent to the patient
regarding the next appointment.

BI-RADS CATEGORY  2: Benign.

## 2021-10-27 LAB — HM PAP SMEAR: HM Pap smear: NORMAL

## 2021-10-27 LAB — HM MAMMOGRAPHY

## 2021-11-04 LAB — RESULTS CONSOLE HPV: CHL HPV: NEGATIVE

## 2022-04-13 ENCOUNTER — Ambulatory Visit (INDEPENDENT_AMBULATORY_CARE_PROVIDER_SITE_OTHER): Payer: No Typology Code available for payment source | Admitting: Nurse Practitioner

## 2022-04-13 ENCOUNTER — Encounter: Payer: Self-pay | Admitting: Nurse Practitioner

## 2022-04-13 VITALS — BP 100/68 | HR 66 | Temp 97.4°F | Ht 67.0 in | Wt 196.2 lb

## 2022-04-13 DIAGNOSIS — R21 Rash and other nonspecific skin eruption: Secondary | ICD-10-CM | POA: Diagnosis not present

## 2022-04-13 DIAGNOSIS — R7303 Prediabetes: Secondary | ICD-10-CM

## 2022-04-13 MED ORDER — KETOCONAZOLE 2 % EX SHAM: 1.0000 | MEDICATED_SHAMPOO | CUTANEOUS | 0 refills | Status: DC

## 2022-04-13 NOTE — Assessment & Plan Note (Signed)
She has been controlling her sugars with healthy nutrition and exercise.  She will bring in a copy of her recent labs to her next visit.

## 2022-04-13 NOTE — Progress Notes (Signed)
New Patient Visit  BP 100/68   Pulse 66   Temp (!) 97.4 F (36.3 C) (Temporal)   Ht 5\' 7"  (1.702 m)   Wt 196 lb 3.2 oz (89 kg)   SpO2 93%   BMI 30.73 kg/m    Subjective:    Patient ID: Joanna Santos, female    DOB: 09/24/1960, 61 y.o.   MRN: [id number]  CC: Chief Complaint  Patient presents with   Establish Care    Np. Est care. Pt c/o recurrent itchy-rash in the back of her head for several mos.     HPI: Joanna Santos is a 61 y.o. female presents for new patient visit to establish care.  Introduced to Designer, jewellery role and practice setting.  All questions answered.  Discussed provider/patient relationship and expectations.  She has noticed a rash on the back of her head. She has tried several different shampoos. She picks at it, however it does not itch or burn. She will put rubbing alcohol on it at time. She denies hair loss.   She has a history of prediabetes and has been focusing on nutrition and exercise. She states that her boyfriend eats healthy and that helps her stay on track. She gets labs done through her work.   Depression and Anxiety Screen Done:      04/13/2022   11:35 AM  Depression screen PHQ 2/9  Decreased Interest 0  Down, Depressed, Hopeless 0  PHQ - 2 Score 0  Altered sleeping 1  Tired, decreased energy 0  Change in appetite 1  Feeling bad or failure about yourself  1  Trouble concentrating 0  Moving slowly or fidgety/restless 0  Suicidal thoughts 0  PHQ-9 Score 3      04/13/2022   11:35 AM  GAD 7 : Generalized Anxiety Score  Nervous, Anxious, on Edge 0  Control/stop worrying 0  Worry too much - different things 0  Trouble relaxing 0  Restless 0  Afraid - awful might happen 0    Past Medical History:  Diagnosis Date   Prediabetes     Past Surgical History:  Procedure Laterality Date   ABDOMINAL HYSTERECTOMY     COLONOSCOPY     FINGER SURGERY Left    TUBAL LIGATION  1999    Family History  Problem Relation Age of Onset    Diabetes Mother    Hypertension Mother    Diabetes Sister    Kidney disease Sister    Miscarriages / [place] Sister    Obesity Sister    Stroke Sister    Diabetes Brother    Obesity Brother      Social History   Tobacco Use   Smoking status: Never   Smokeless tobacco: Never  Vaping Use   Vaping Use: Never used  Substance Use Topics   Alcohol use: Not Currently   Drug use: Never    No current outpatient medications on file prior to visit.   No current facility-administered medications on file prior to visit.     Review of Systems  Constitutional: Negative.   HENT: Negative.    Eyes: Negative.   Respiratory: Negative.    Cardiovascular: Negative.   Gastrointestinal: Negative.   Genitourinary:  Positive for frequency. Negative for dysuria.  Musculoskeletal:  Positive for arthralgias (right knee, follows with chiropractor).  Skin:  Positive for rash (back of head).  Neurological: Negative.   Psychiatric/Behavioral:  Positive for sleep disturbance. Negative for dysphoric mood. The patient is not  nervous/anxious.       Objective:    BP 100/68   Pulse 66   Temp (!) 97.4 F (36.3 C) (Temporal)   Ht 5\' 7"  (1.702 m)   Wt 196 lb 3.2 oz (89 kg)   SpO2 93%   BMI 30.73 kg/m   Wt Readings from Last 3 Encounters:  04/13/22 196 lb 3.2 oz (89 kg)    BP Readings from Last 3 Encounters:  04/13/22 100/68  10/21/11 129/75    Physical Exam Vitals and nursing note reviewed.  Constitutional:      General: She is not in acute distress.    Appearance: Normal appearance.  HENT:     Head: Normocephalic.     Right Ear: Tympanic membrane, ear canal and external ear normal.     Left Ear: Tympanic membrane, ear canal and external ear normal.  Eyes:     Conjunctiva/sclera: Conjunctivae normal.  Cardiovascular:     Rate and Rhythm: Normal rate and regular rhythm.     Pulses: Normal pulses.     Heart sounds: Normal heart sounds.  Pulmonary:     Effort: Pulmonary effort  is normal.     Breath sounds: Normal breath sounds.  Abdominal:     Palpations: Abdomen is soft.     Tenderness: There is no abdominal tenderness.  Musculoskeletal:     Cervical back: Normal range of motion and neck supple. No tenderness.  Lymphadenopathy:     Cervical: No cervical adenopathy.  Skin:    General: Skin is warm.     Comments: Dry, scaly rash to posterior scalp  Neurological:     General: No focal deficit present.     Mental Status: She is alert and oriented to person, place, and time.  Psychiatric:        Mood and Affect: Mood normal.        Behavior: Behavior normal.        Thought Content: Thought content normal.        Judgment: Judgment normal.       Assessment & Plan:   Problem List Items Addressed This Visit       Musculoskeletal and Integument   Rash - Primary    Dry, scaly rash to the back of her head under her hair.  No hair loss noted.  Potential psoriasis versus fungal infection.  We will have her start ketoconazole shampoo twice a week and refer to dermatology.      Relevant Orders   Ambulatory referral to Dermatology     Other   Prediabetes    She has been controlling her sugars with healthy nutrition and exercise.  She will bring in a copy of her recent labs to her next visit.        Follow up plan: Return in about 3 months (around 07/14/2022) for CPE.

## 2022-04-13 NOTE — Patient Instructions (Signed)
It was great to see you!  Start the ketoconazole shampoo twice a week. I have also placed a referral to dermatology.   Let's follow-up in 2-3 months, sooner if you have concerns.  If a referral was placed today, you will be contacted for an appointment. Please note that routine referrals can sometimes take up to 3-4 weeks to process. Please call our office if you haven't heard anything after this time frame.  Take care,  Vance Peper, NP

## 2022-04-13 NOTE — Assessment & Plan Note (Signed)
Dry, scaly rash to the back of her head under her hair.  No hair loss noted.  Potential psoriasis versus fungal infection.  We will have her start ketoconazole shampoo twice a week and refer to dermatology.

## 2022-07-13 NOTE — Progress Notes (Unsigned)
There were no vitals taken for this visit.   Subjective:    Patient ID: Joanna Santos, female    DOB: 19-Jan-1961, 62 y.o.   MRN: [id number]  CC: No chief complaint on file.   HPI: Joanna Santos is a 62 y.o. female presenting on 07/14/2022 for comprehensive medical examination. Current medical complaints include:{Blank single:19197::"none","***"}  She currently lives with: Menopausal Symptoms: {Blank single:19197::"yes","no"}  Depression Screen done today and results listed below:     04/13/2022   11:35 AM  Depression screen PHQ 2/9  Decreased Interest 0  Down, Depressed, Hopeless 0  PHQ - 2 Score 0  Altered sleeping 1  Tired, decreased energy 0  Change in appetite 1  Feeling bad or failure about yourself  1  Trouble concentrating 0  Moving slowly or fidgety/restless 0  Suicidal thoughts 0  PHQ-9 Score 3    The patient {has/does not have:19849} a history of falls. I {did/did not:19850} complete a risk assessment for falls. A plan of care for falls {was/was not:19852} documented.   Past Medical History:  Past Medical History:  Diagnosis Date   Prediabetes     Surgical History:  Past Surgical History:  Procedure Laterality Date   ABDOMINAL HYSTERECTOMY     COLONOSCOPY     FINGER SURGERY Left    TUBAL LIGATION  1999    Medications:  Current Outpatient Medications on File Prior to Visit  Medication Sig   ketoconazole (NIZORAL) 2 % shampoo Apply 1 Application topically 2 (two) times a week.   No current facility-administered medications on file prior to visit.    Allergies:  No Known Allergies  Social History:  Social History   Socioeconomic History   Marital status: Significant Other    Spouse name: Not on file   Number of children: Not on file   Years of education: Not on file   Highest education level: Not on file  Occupational History   Not on file  Tobacco Use   Smoking status: Never   Smokeless tobacco: Never  Vaping Use   Vaping Use: Never  used  Substance and Sexual Activity   Alcohol use: Not Currently   Drug use: Never   Sexual activity: Yes    Birth control/protection: Surgical  Other Topics Concern   Not on file  Social History Narrative   Not on file   Social Determinants of Health   Financial Resource Strain: Not on file  Food Insecurity: Not on file  Transportation Needs: Not on file  Physical Activity: Not on file  Stress: Not on file  Social Connections: Not on file  Intimate Partner Violence: Not on file   Social History   Tobacco Use  Smoking Status Never  Smokeless Tobacco Never   Social History   Substance and Sexual Activity  Alcohol Use Not Currently    Family History:  Family History  Problem Relation Age of Onset   Diabetes Mother    Hypertension Mother    Diabetes Sister    Kidney disease Sister    Miscarriages / [place] Sister    Obesity Sister    Stroke Sister    Diabetes Brother    Obesity Brother     Past medical history, surgical history, medications, allergies, family history and social history reviewed with patient today and changes made to appropriate areas of the chart.   ROS All other ROS negative except what is listed above and in the HPI.      Objective:  There were no vitals taken for this visit.  Wt Readings from Last 3 Encounters:  04/13/22 196 lb 3.2 oz (89 kg)    Physical Exam  Results for orders placed or performed in visit on 04/21/22  HM MAMMOGRAPHY  Result Value Ref Range   HM Mammogram 0-4 Bi-Rad 0-4 Bi-Rad, Self Reported Normal  HM PAP SMEAR  Result Value Ref Range   HM Pap smear normal   Results Console HPV  Result Value Ref Range   CHL HPV Negative       Assessment & Plan:   Problem List Items Addressed This Visit   None    Follow up plan: No follow-ups on file.   LABORATORY TESTING:  - Pap smear: up to date  IMMUNIZATIONS:   - Tdap: Tetanus vaccination status reviewed: last tetanus booster within 10 years. -  Influenza: {Blank single:19197::"Up to date","Administered today","Postponed to flu season","Refused","Given elsewhere"} - Pneumovax: Not applicable - Prevnar: Not applicable - HPV: Not applicable - Zostavax vaccine: {Blank single:19197::"Up to date","Administered today","Not applicable","Refused","Given elsewhere"}  SCREENING: -Mammogram: Up to date  - Colonoscopy: {Blank single:19197::"Up to date","Ordered today","Not applicable","Refused","Done elsewhere"}  - Bone Density: Not applicable  -Hearing Test: Not applicable  -Spirometry: Not applicable   PATIENT COUNSELING:   Advised to take 1 mg of folate supplement per day if capable of pregnancy.   Sexuality: Discussed sexually transmitted diseases, partner selection, use of condoms, avoidance of unintended pregnancy  and contraceptive alternatives.   Advised to avoid cigarette smoking.  I discussed with the patient that most people either abstain from alcohol or drink within safe limits (<=14/week and <=4 drinks/occasion for males, <=7/weeks and <= 3 drinks/occasion for females) and that the risk for alcohol disorders and other health effects rises proportionally with the number of drinks per week and how often a drinker exceeds daily limits.  Discussed cessation/primary prevention of drug use and availability of treatment for abuse.   Diet: Encouraged to adjust caloric intake to maintain  or achieve ideal body weight, to reduce intake of dietary saturated fat and total fat, to limit sodium intake by avoiding high sodium foods and not adding table salt, and to maintain adequate dietary potassium and calcium preferably from fresh fruits, vegetables, and low-fat dairy products.    stressed the importance of regular exercise  Injury prevention: Discussed safety belts, safety helmets, smoke detector, smoking near bedding or upholstery.   Dental health: Discussed importance of regular tooth brushing, flossing, and dental visits.    NEXT  PREVENTATIVE PHYSICAL DUE IN 1 YEAR. No follow-ups on file.

## 2022-07-14 ENCOUNTER — Encounter: Payer: Self-pay | Admitting: Nurse Practitioner

## 2022-07-14 ENCOUNTER — Ambulatory Visit (INDEPENDENT_AMBULATORY_CARE_PROVIDER_SITE_OTHER): Payer: No Typology Code available for payment source | Admitting: Nurse Practitioner

## 2022-07-14 VITALS — BP 118/78 | HR 57 | Temp 96.1°F | Ht 66.5 in | Wt 195.8 lb

## 2022-07-14 DIAGNOSIS — R7303 Prediabetes: Secondary | ICD-10-CM

## 2022-07-14 DIAGNOSIS — E782 Mixed hyperlipidemia: Secondary | ICD-10-CM | POA: Diagnosis not present

## 2022-07-14 DIAGNOSIS — Z0000 Encounter for general adult medical examination without abnormal findings: Secondary | ICD-10-CM | POA: Diagnosis not present

## 2022-07-14 DIAGNOSIS — Z114 Encounter for screening for human immunodeficiency virus [HIV]: Secondary | ICD-10-CM | POA: Diagnosis not present

## 2022-07-14 DIAGNOSIS — Z1211 Encounter for screening for malignant neoplasm of colon: Secondary | ICD-10-CM

## 2022-07-14 DIAGNOSIS — Z1159 Encounter for screening for other viral diseases: Secondary | ICD-10-CM

## 2022-07-14 LAB — CBC
HCT: 43.3 % (ref 36.0–46.0)
Hemoglobin: 14.5 g/dL (ref 12.0–15.0)
MCHC: 33.4 g/dL (ref 30.0–36.0)
MCV: 91.8 fl (ref 78.0–100.0)
Platelets: 226 10*3/uL (ref 150.0–400.0)
RBC: 4.72 Mil/uL (ref 3.87–5.11)
RDW: 13.7 % (ref 11.5–15.5)
WBC: 4.5 10*3/uL (ref 4.0–10.5)

## 2022-07-14 LAB — COMPREHENSIVE METABOLIC PANEL
ALT: 23 U/L (ref 0–35)
AST: 24 U/L (ref 0–37)
Albumin: 4.4 g/dL (ref 3.5–5.2)
Alkaline Phosphatase: 89 U/L (ref 39–117)
BUN: 15 mg/dL (ref 6–23)
CO2: 27 mEq/L (ref 19–32)
Calcium: 9.6 mg/dL (ref 8.4–10.5)
Chloride: 108 mEq/L (ref 96–112)
Creatinine, Ser: 0.92 mg/dL (ref 0.40–1.20)
GFR: 66.53 mL/min (ref >60.00)
Glucose, Bld: 140 mg/dL — ABNORMAL HIGH (ref 70–99)
Potassium: 4.2 mEq/L (ref 3.5–5.1)
Sodium: 143 mEq/L (ref 135–145)
Total Bilirubin: 0.4 mg/dL (ref 0.2–1.2)
Total Protein: 6.7 g/dL (ref 6.0–8.3)

## 2022-07-14 LAB — HEMOGLOBIN A1C: Hgb A1c MFr Bld: 6.1 % (ref 4.6–6.5)

## 2022-07-14 LAB — LIPID PANEL
Cholesterol: 172 mg/dL (ref 0–200)
HDL: 38.9 mg/dL — ABNORMAL LOW (ref >39.00)
NonHDL: 132.99
Total CHOL/HDL Ratio: 4
Triglycerides: 319 mg/dL — ABNORMAL HIGH (ref 0.0–149.0)
VLDL: 63.8 mg/dL — ABNORMAL HIGH (ref 0.0–40.0)

## 2022-07-14 LAB — LDL CHOLESTEROL, DIRECT: Direct LDL: 95 mg/dL

## 2022-07-14 NOTE — Patient Instructions (Signed)
It was great to see you!  We are checking your labs today and will let you know the results via mychart/phone.   Let's follow-up in 1 year, sooner if you have concerns.  If a referral was placed today, you will be contacted for an appointment. Please note that routine referrals can sometimes take up to 3-4 weeks to process. Please call our office if you haven't heard anything after this time frame.  Take care,  Traves Majchrzak, NP  

## 2022-07-14 NOTE — Assessment & Plan Note (Signed)
She states her triglycerides have been elevated in the past. Will check fasting lipid panel today and treat based on results.

## 2022-07-14 NOTE — Assessment & Plan Note (Signed)
A1c has been elevated in the past, will repeat today and treat based on results.

## 2022-07-15 LAB — HEPATITIS C ANTIBODY: Hepatitis C Ab: NONREACTIVE

## 2022-07-15 LAB — HIV ANTIBODY (ROUTINE TESTING W REFLEX): HIV 1&2 Ab, 4th Generation: NONREACTIVE

## 2022-08-03 ENCOUNTER — Other Ambulatory Visit: Payer: Self-pay | Admitting: Nurse Practitioner

## 2022-09-08 LAB — HM COLONOSCOPY

## 2023-03-11 ENCOUNTER — Telehealth: Payer: Self-pay | Admitting: Nurse Practitioner

## 2023-03-11 NOTE — Telephone Encounter (Signed)
Please call this # [phone] [facility] )  pt pharmacy in reference to carvedilol medication

## 2023-03-11 NOTE — Telephone Encounter (Signed)
I returned call and the representative was unable to find patient in her system.

## 2023-06-11 ENCOUNTER — Encounter: Payer: No Typology Code available for payment source | Admitting: Nurse Practitioner

## 2023-07-06 ENCOUNTER — Telehealth: Payer: Self-pay | Admitting: Nurse Practitioner

## 2023-07-06 NOTE — Telephone Encounter (Signed)
 CLINICAL USE BELOW THIS LINE (use X to signify action taken)  ___ Form received and placed in providers office for signature. ___ Form completed and faxed to LOA Dept.  _x__ Form completed.  ___ Charge sheet and copy of form in front office folder for office supervisor.   I tried to call patient and voicemail box was full.

## 2023-07-06 NOTE — Telephone Encounter (Signed)
   CLINICAL USE BELOW THIS LINE (use X to signify action taken)  _X__ Form received and placed in providers office for signature. ___ Form completed and faxed to LOA Dept.  ___ Form completed & LVM to notify patient ready for pick up.  ___ Charge sheet and copy of form in front office folder for office supervisor.

## 2023-07-06 NOTE — Telephone Encounter (Signed)
 PAPERWORK/FORMS received  Dropped off by: Pt's daughter Call back #: [phone] Individual made aware of 3-5 business day turn around (YES/NO):Y GREEN charge sheet completed and patient made aware of possible charge (YES/NO): Y Placed in provider folder at front desk. ~~~ route to CMA/provider Team  CLINICAL USE BELOW THIS LINE (use X to signify action taken)  ___ Form received and placed in providers office for signature. ___ Form completed and faxed to LOA Dept.  ___ Form completed & LVM to notify patient ready for pick up.  ___ Charge sheet and copy of form in front office folder for office supervisor.

## 2023-07-08 NOTE — Telephone Encounter (Addendum)
 I called patient on 07/06/23 and no answer and no voicemail.  I called patient today 07/08/2023 and notified her that form was faxed in but need to get waist and hip measurements. Patient will call back this afternoon when her daughter comes by to measure her. Afterwards paper can be scanned into her chart.

## 2023-07-09 NOTE — Telephone Encounter (Signed)
 I called patient and no answer and voicemail box was full.

## 2023-07-09 NOTE — Telephone Encounter (Signed)
 Source  Joanna Santos (Patient)   Subject  Joanna Santos (Patient)   Topic  Clinical - Medical Advice    Communication  Reason for CRM: Patient called in for measurements patients states its  40 for waist and 45 for hips      I wrote it in on paper work and refaxed with confirmation.

## 2023-07-09 NOTE — Telephone Encounter (Signed)
 Copied from CRM [id number]. Topic: Clinical - Medical Advice >> Jul 09, 2023  9:29 AM Elizebeth Brooking wrote: Reason for CRM: Patient called in for measurements patients states its  40 for waist and 45 for hips

## 2023-07-12 NOTE — Telephone Encounter (Signed)
 Measurement were updated on form and form refaxed on Friday.

## 2023-07-21 ENCOUNTER — Ambulatory Visit: Payer: No Typology Code available for payment source | Admitting: Nurse Practitioner

## 2023-07-21 ENCOUNTER — Encounter: Payer: Self-pay | Admitting: Nurse Practitioner

## 2023-07-21 VITALS — BP 110/78 | HR 70 | Temp 97.6°F | Ht 66.5 in | Wt 197.6 lb

## 2023-07-21 DIAGNOSIS — R7303 Prediabetes: Secondary | ICD-10-CM

## 2023-07-21 DIAGNOSIS — E119 Type 2 diabetes mellitus without complications: Secondary | ICD-10-CM

## 2023-07-21 DIAGNOSIS — E782 Mixed hyperlipidemia: Secondary | ICD-10-CM

## 2023-07-21 DIAGNOSIS — Z0000 Encounter for general adult medical examination without abnormal findings: Secondary | ICD-10-CM | POA: Diagnosis not present

## 2023-07-21 DIAGNOSIS — E669 Obesity, unspecified: Secondary | ICD-10-CM

## 2023-07-21 LAB — LIPID PANEL
Cholesterol: 184 mg/dL (ref 0–200)
HDL: 41.4 mg/dL (ref >39.00)
LDL Cholesterol: 80 mg/dL (ref 0–99)
NonHDL: 142.72
Total CHOL/HDL Ratio: 4
Triglycerides: 313 mg/dL — ABNORMAL HIGH (ref 0.0–149.0)
VLDL: 62.6 mg/dL — ABNORMAL HIGH (ref 0.0–40.0)

## 2023-07-21 LAB — CBC WITH DIFFERENTIAL/PLATELET
Basophils Absolute: 0 10*3/uL (ref 0.0–0.1)
Basophils Relative: 0.8 % (ref 0.0–3.0)
Eosinophils Absolute: 0.1 10*3/uL (ref 0.0–0.7)
Eosinophils Relative: 2.8 % (ref 0.0–5.0)
HCT: 45.2 % (ref 36.0–46.0)
Hemoglobin: 15.2 g/dL — ABNORMAL HIGH (ref 12.0–15.0)
Lymphocytes Relative: 26.9 % (ref 12.0–46.0)
Lymphs Abs: 1.3 10*3/uL (ref 0.7–4.0)
MCHC: 33.6 g/dL (ref 30.0–36.0)
MCV: 92.2 fL (ref 78.0–100.0)
Monocytes Absolute: 0.4 10*3/uL (ref 0.1–1.0)
Monocytes Relative: 8.3 % (ref 3.0–12.0)
Neutro Abs: 2.9 10*3/uL (ref 1.4–7.7)
Neutrophils Relative %: 61.2 % (ref 43.0–77.0)
Platelets: 207 10*3/uL (ref 150.0–400.0)
RBC: 4.91 Mil/uL (ref 3.87–5.11)
RDW: 13.5 % (ref 11.5–15.5)
WBC: 4.7 10*3/uL (ref 4.0–10.5)

## 2023-07-21 LAB — COMPREHENSIVE METABOLIC PANEL
ALT: 28 U/L (ref 0–35)
AST: 26 U/L (ref 0–37)
Albumin: 4.5 g/dL (ref 3.5–5.2)
Alkaline Phosphatase: 76 U/L (ref 39–117)
BUN: 17 mg/dL (ref 6–23)
CO2: 28 meq/L (ref 19–32)
Calcium: 9.8 mg/dL (ref 8.4–10.5)
Chloride: 105 meq/L (ref 96–112)
Creatinine, Ser: 0.89 mg/dL (ref 0.40–1.20)
GFR: 68.74 mL/min (ref >60.00)
Glucose, Bld: 141 mg/dL — ABNORMAL HIGH (ref 70–99)
Potassium: 4.2 meq/L (ref 3.5–5.1)
Sodium: 141 meq/L (ref 135–145)
Total Bilirubin: 0.5 mg/dL (ref 0.2–1.2)
Total Protein: 7 g/dL (ref 6.0–8.3)

## 2023-07-21 LAB — HEMOGLOBIN A1C: Hgb A1c MFr Bld: 7.1 % — ABNORMAL HIGH (ref 4.6–6.5)

## 2023-07-21 NOTE — Assessment & Plan Note (Signed)
Chronic, stable. Check A1c today and treat based on results.

## 2023-07-21 NOTE — Patient Instructions (Signed)
It was great to see you!  We are checking your labs today and will let you know the results via mychart/phone.   Keep up the great work!   Let's follow-up in 1 year, sooner if you have concerns.  If a referral was placed today, you will be contacted for an appointment. Please note that routine referrals can sometimes take up to 3-4 weeks to process. Please call our office if you haven't heard anything after this time frame.  Take care,  Nivin Braniff, NP  

## 2023-07-21 NOTE — Assessment & Plan Note (Signed)
BMI 31.4. Discussed nutrition, exercise.

## 2023-07-21 NOTE — Progress Notes (Signed)
 BP 110/78 (BP Location: Left Arm, Patient Position: Sitting, Cuff Size: Normal)   Pulse 70   Temp 97.6 F (36.4 C)   Ht 5' 6.5" (1.689 m)   Wt 197 lb 9.6 oz (89.6 kg)   SpO2 97%   BMI 31.42 kg/m    Subjective:    Patient ID: Joanna Santos, female    DOB: February 20, 1961, 63 y.o.   MRN: [id number]  CC: Chief Complaint  Patient presents with   Annual Exam    With fasting lab work and form completion    HPI: Joanna Santos is a 63 y.o. female presenting on 07/21/2023 for comprehensive medical examination. Current medical complaints include:none  She currently lives with: alone  Depression and Anxiety Screen done today and results listed below:     07/21/2023    8:38 AM 07/14/2022    8:23 AM 04/13/2022   11:35 AM  Depression screen PHQ 2/9  Decreased Interest 0 0 0  Down, Depressed, Hopeless 0 0 0  PHQ - 2 Score 0 0 0  Altered sleeping 0  1  Tired, decreased energy 0  0  Change in appetite 0  1  Feeling bad or failure about yourself  0  1  Trouble concentrating 0  0  Moving slowly or fidgety/restless 0  0  Suicidal thoughts 0  0  PHQ-9 Score 0  3  Difficult doing work/chores Not difficult at all        07/21/2023    8:38 AM 04/13/2022   11:35 AM  GAD 7 : Generalized Anxiety Score  Nervous, Anxious, on Edge 0 0  Control/stop worrying 0 0  Worry too much - different things 0 0  Trouble relaxing 0 0  Restless 0 0  Easily annoyed or irritable 0   Afraid - awful might happen 0 0  Total GAD 7 Score 0   Anxiety Difficulty Not difficult at all     The patient does not have a history of falls. I did not complete a risk assessment for falls. A plan of care for falls was not documented.   Past Medical History:  Past Medical History:  Diagnosis Date   Prediabetes     Surgical History:  Past Surgical History:  Procedure Laterality Date   ABDOMINAL HYSTERECTOMY     COLONOSCOPY     FINGER SURGERY Left    TUBAL LIGATION  1999    Medications:  Current Outpatient  Medications on File Prior to Visit  Medication Sig   betamethasone, augmented, (DIPROLENE) 0.05 % gel as needed.   CHOLECALCIFEROL-VITAMIN C PO daily.   ketoconazole  (NIZORAL ) 2 % shampoo APPLY 1 APPLICATION TOPICALLY TWO TIMES A WEEK   MAGNESIUM PO daily.   Omega-3 Fatty Acids (FISH OIL PO) daily.   No current facility-administered medications on file prior to visit.    Allergies:  No Known Allergies  Social History:  Social History   Socioeconomic History   Marital status: Significant Other    Spouse name: Not on file   Number of children: Not on file   Years of education: Not on file   Highest education level: 12th grade  Occupational History   Not on file  Tobacco Use   Smoking status: Never   Smokeless tobacco: Never  Vaping Use   Vaping status: Never Used  Substance and Sexual Activity   Alcohol use: Not Currently   Drug use: Never   Sexual activity: Yes    Birth control/protection: Surgical  Other Topics Concern   Not on file  Social History Narrative   Not on file   Social Drivers of Health   Financial Resource Strain: Low Risk  (07/20/2023)   Overall Financial Resource Strain (CARDIA)    Difficulty of Paying Living Expenses: Not very hard  Food Insecurity: No Food Insecurity (07/20/2023)   Hunger Vital Sign    Worried About Running Out of Food in the Last Year: Never true    Ran Out of Food in the Last Year: Never true  Transportation Needs: No Transportation Needs (07/20/2023)   PRAPARE - Administrator, Civil Service (Medical): No    Lack of Transportation (Non-Medical): No  Physical Activity: Insufficiently Active (07/20/2023)   Exercise Vital Sign    Days of Exercise per Week: 3 days    Minutes of Exercise per Session: 20 min  Stress: No Stress Concern Present (07/20/2023)   [employer] of Occupational Health - Occupational Stress Questionnaire    Feeling of Stress : Only a little  Social Connections: Moderately Isolated (07/20/2023)    Social Connection and Isolation Panel [NHANES]    Frequency of Communication with Friends and Family: More than three times a week    Frequency of Social Gatherings with Friends and Family: Three times a week    Attends Religious Services: 1 to 4 times per year    Active Member of Clubs or Organizations: No    Attends Engineer, structural: Not on file    Marital Status: Divorced  Catering manager Violence: Not on file   Social History   Tobacco Use  Smoking Status Never  Smokeless Tobacco Never   Social History   Substance and Sexual Activity  Alcohol Use Not Currently    Family History:  Family History  Problem Relation Age of Onset   Diabetes Mother    Hypertension Mother    Diabetes Sister    Kidney disease Sister    Miscarriages / Stillbirths Sister    Obesity Sister    Stroke Sister    Diabetes Brother    Obesity Brother     Past medical history, surgical history, medications, allergies, family history and social history reviewed with patient today and changes made to appropriate areas of the chart.   Review of Systems  Constitutional: Negative.   HENT: Negative.    Eyes: Negative.   Respiratory: Negative.    Cardiovascular: Negative.   Gastrointestinal: Negative.   Genitourinary: Negative.   Musculoskeletal: Negative.   Skin: Negative.   Neurological: Negative.   Psychiatric/Behavioral: Negative.     All other ROS negative except what is listed above and in the HPI.      Objective:    BP 110/78 (BP Location: Left Arm, Patient Position: Sitting, Cuff Size: Normal)   Pulse 70   Temp 97.6 F (36.4 C)   Ht 5' 6.5" (1.689 m)   Wt 197 lb 9.6 oz (89.6 kg)   SpO2 97%   BMI 31.42 kg/m   Wt Readings from Last 3 Encounters:  07/21/23 197 lb 9.6 oz (89.6 kg)  07/14/22 195 lb 12.8 oz (88.8 kg)  04/13/22 196 lb 3.2 oz (89 kg)    Physical Exam Vitals and nursing note reviewed.  Constitutional:      General: She is not in acute distress.     Appearance: Normal appearance.  HENT:     Head: Normocephalic and atraumatic.     Right Ear: Tympanic membrane, ear canal and external ear  normal.     Left Ear: Tympanic membrane, ear canal and external ear normal.     Mouth/Throat:     Mouth: Mucous membranes are moist.     Pharynx: No posterior oropharyngeal erythema.  Eyes:     Conjunctiva/sclera: Conjunctivae normal.  Cardiovascular:     Rate and Rhythm: Normal rate and regular rhythm.     Pulses: Normal pulses.     Heart sounds: Normal heart sounds.  Pulmonary:     Effort: Pulmonary effort is normal.     Breath sounds: Normal breath sounds.  Abdominal:     Palpations: Abdomen is soft.     Tenderness: There is no abdominal tenderness.  Musculoskeletal:        General: Normal range of motion.     Cervical back: Normal range of motion and neck supple.     Right lower leg: No edema.     Left lower leg: No edema.  Lymphadenopathy:     Cervical: No cervical adenopathy.  Skin:    General: Skin is warm and dry.  Neurological:     General: No focal deficit present.     Mental Status: She is alert and oriented to person, place, and time.     Cranial Nerves: No cranial nerve deficit.     Coordination: Coordination normal.     Gait: Gait normal.  Psychiatric:        Mood and Affect: Mood normal.        Behavior: Behavior normal.        Thought Content: Thought content normal.        Judgment: Judgment normal.     Results for orders placed or performed in visit on 07/16/22  HM COLONOSCOPY   Collection Time: 06/06/19 12:00 AM  Result Value Ref Range   HM Colonoscopy See Report (in chart) See Report (in chart), Patient Reported      Assessment & Plan:   Problem List Items Addressed This Visit       Other   Prediabetes   Chronic, stable. Check A1c today and treat based on results.       Relevant Orders   Hemoglobin A1c   Mixed hyperlipidemia   Chronic, stable. Check CMP, CBC, lipid panel today.       Relevant  Orders   CBC with Differential/Platelet   Comprehensive metabolic panel   Lipid panel   Obesity (BMI 30-39.9)   BMI 31.4. Discussed nutrition, exercise.       Routine general medical examination at a health care facility - Primary   Health maintenance reviewed and updated. Discussed nutrition, exercise. Check CMP, CBC today. Follow-up 1 year.          Follow up plan: Return in about 1 year (around 07/20/2024) for CPE.   LABORATORY TESTING:  - Pap smear: not applicable  IMMUNIZATIONS:   - Tdap: Tetanus vaccination status reviewed: last tetanus booster within 10 years. - Influenza:  Declined - Pneumovax: Not applicable - Prevnar: Not applicable - HPV: Not applicable - Shingrix  vaccine: Not applicable  SCREENING: -Mammogram: Done elsewhere  - Colonoscopy: Done elsewhere  - Bone Density: Not applicable   PATIENT COUNSELING:   Advised to take 1 mg of folate supplement per day if capable of pregnancy.   Sexuality: Discussed sexually transmitted diseases, partner selection, use of condoms, avoidance of unintended pregnancy  and contraceptive alternatives.   Advised to avoid cigarette smoking.  I discussed with the patient that most people either abstain from alcohol  or drink within safe limits (<=14/week and <=4 drinks/occasion for males, <=7/weeks and <= 3 drinks/occasion for females) and that the risk for alcohol disorders and other health effects rises proportionally with the number of drinks per week and how often a drinker exceeds daily limits.  Discussed cessation/primary prevention of drug use and availability of treatment for abuse.   Diet: Encouraged to adjust caloric intake to maintain  or achieve ideal body weight, to reduce intake of dietary saturated fat and total fat, to limit sodium intake by avoiding high sodium foods and not adding table salt, and to maintain adequate dietary potassium and calcium preferably from fresh fruits, vegetables, and low-fat dairy  products.    stressed the importance of regular exercise  Injury prevention: Discussed safety belts, safety helmets, smoke detector, smoking near bedding or upholstery.   Dental health: Discussed importance of regular tooth brushing, flossing, and dental visits.    NEXT PREVENTATIVE PHYSICAL DUE IN 1 YEAR. Return in about 1 year (around 07/20/2024) for CPE.  Joanna Santos A Kadir Azucena

## 2023-07-21 NOTE — Assessment & Plan Note (Signed)
Chronic, stable.  Check CMP, CBC, lipid panel today. 

## 2023-07-21 NOTE — Assessment & Plan Note (Signed)
Health maintenance reviewed and updated. Discussed nutrition, exercise. Check CMP, CBC today. Follow-up 1 year.   

## 2023-07-22 MED ORDER — BLOOD GLUCOSE MONITORING SUPPL DEVI: 1.0000 | Freq: Every day | 0 refills | Status: AC

## 2023-07-22 MED ORDER — BLOOD GLUCOSE TEST VI STRP: 1.0000 | ORAL_STRIP | Freq: Every day | 1 refills | Status: AC

## 2023-07-22 MED ORDER — LANCETS MISC. MISC: 1.0000 | Freq: Every day | 1 refills | Status: AC

## 2023-07-22 NOTE — Addendum Note (Signed)
Addended by: Rodman Pickle A on: 07/22/2023 09:36 AM   Modules accepted: Orders

## 2023-08-06 ENCOUNTER — Encounter: Payer: Self-pay | Admitting: Nurse Practitioner

## 2023-10-11 ENCOUNTER — Ambulatory Visit: Admitting: Nurse Practitioner

## 2023-10-19 ENCOUNTER — Ambulatory Visit (INDEPENDENT_AMBULATORY_CARE_PROVIDER_SITE_OTHER): Admitting: Nurse Practitioner

## 2023-10-19 ENCOUNTER — Encounter: Payer: Self-pay | Admitting: Nurse Practitioner

## 2023-10-19 VITALS — BP 106/68 | HR 63 | Temp 97.8°F | Ht 66.5 in | Wt 186.8 lb

## 2023-10-19 DIAGNOSIS — E119 Type 2 diabetes mellitus without complications: Secondary | ICD-10-CM | POA: Diagnosis not present

## 2023-10-19 DIAGNOSIS — Z23 Encounter for immunization: Secondary | ICD-10-CM

## 2023-10-19 LAB — MICROALBUMIN / CREATININE URINE RATIO
Creatinine,U: 95.3 mg/dL
Microalb Creat Ratio: UNDETERMINED mg/g (ref 0.0–30.0)
Microalb, Ur: <0.7 mg/dL

## 2023-10-19 LAB — POCT GLYCOSYLATED HEMOGLOBIN (HGB A1C)
HbA1c POC (<> result, manual entry): 5.9 % (ref 4.0–5.6)
HbA1c, POC (controlled diabetic range): 5.9 % (ref 0.0–7.0)
HbA1c, POC (prediabetic range): 5.9 % (ref 5.7–6.4)
Hemoglobin A1C: 5.9 % — AB (ref 4.0–5.6)

## 2023-10-19 NOTE — Assessment & Plan Note (Signed)
 Chronic, stable. Diabetes is well-controlled with lifestyle modifications, and her A1c is at 5.9%. Continue lifestyle modifications, including regular exercise and dietary changes. Monitor blood glucose levels two to three times a week. Schedule a yearly eye exam for diabetic retinopathy. Educated on signs of neuropathy and the importance of foot care. Check urine microalbumin today. Her last LDL was 80 and is hesitant to start any medications. Her BP is also 106/68 and don't want to drop BP too low with adding ACE/ARB.

## 2023-10-19 NOTE — Progress Notes (Signed)
 Established Patient Office Visit  Subjective   Patient ID: Joanna Santos, female    DOB: 05-24-1961  Age: 63 y.o. MRN: [id number]  Chief Complaint  Patient presents with   Prediabetes    Follow up, shingles vaccine    HPI  Discussed the use of AI scribe software for clinical note transcription with the patient, who gave verbal consent to proceed.  History of Present Illness   Joanna Santos, a patient with a recent diagnosis of diabetes, presents for a follow-up visit. She reports that she has been testing her blood sugar levels daily and has seen numbers ranging from under 100 to 163. She attributes a previous increase in her blood sugar levels to a decrease in physical activity after she stopped working at [employer]. She has since increased her physical activity to five days a week and has lost 11 pounds. She has also made dietary changes, including reducing her intake of sweets and chocolate. She reports no symptoms of chest pain, shortness of breath, numbness, or tingling in her feet. She had an eye exam in December and plans to have another one next December. She is due for a shingles vaccine.        ROS See pertinent positives and negatives per HPI.    Objective:     BP 106/68 (BP Location: Left Arm, Patient Position: Sitting, Cuff Size: Normal)   Pulse 63   Temp 97.8 F (36.6 C)   Ht 5' 6.5" (1.689 m)   Wt 186 lb 12.8 oz (84.7 kg)   SpO2 97%   BMI 29.70 kg/m  BP Readings from Last 3 Encounters:  10/19/23 106/68  07/21/23 110/78  07/14/22 118/78   Wt Readings from Last 3 Encounters:  10/19/23 186 lb 12.8 oz (84.7 kg)  07/21/23 197 lb 9.6 oz (89.6 kg)  07/14/22 195 lb 12.8 oz (88.8 kg)     Physical Exam Vitals and nursing note reviewed.  Constitutional:      General: She is not in acute distress.    Appearance: Normal appearance.  HENT:     Head: Normocephalic.  Eyes:     Conjunctiva/sclera: Conjunctivae normal.  Cardiovascular:     Rate and Rhythm: Normal  rate and regular rhythm.     Pulses: Normal pulses.     Heart sounds: Normal heart sounds.  Pulmonary:     Effort: Pulmonary effort is normal.     Breath sounds: Normal breath sounds.  Musculoskeletal:     Cervical back: Normal range of motion.  Skin:    General: Skin is warm.  Neurological:     General: No focal deficit present.     Mental Status: She is alert and oriented to person, place, and time.  Psychiatric:        Mood and Affect: Mood normal.        Behavior: Behavior normal.        Thought Content: Thought content normal.        Judgment: Judgment normal.    The 10-year ASCVD risk score (Arnett DK, et al., 2019) is: 7.2%    Assessment & Plan:   Problem List Items Addressed This Visit       Endocrine   Diabetes mellitus without complication (HCC) - Primary   Chronic, stable. Diabetes is well-controlled with lifestyle modifications, and her A1c is at 5.9%. Continue lifestyle modifications, including regular exercise and dietary changes. Monitor blood glucose levels two to three times a week. Schedule a yearly eye exam  for diabetic retinopathy. Educated on signs of neuropathy and the importance of foot care. Check urine microalbumin today. Her last LDL was 80 and is hesitant to start any medications. Her BP is also 106/68 and don't want to drop BP too low with adding ACE/ARB.       Relevant Orders   POCT glycosylated hemoglobin (Hb A1C) (Completed)   Microalbumin / creatinine urine ratio   Other Visit Diagnoses       Immunization due       Shingrix #1 & Prevnar 20 given today.   Relevant Orders   Varicella-zoster vaccine IM (Completed)   Pneumococcal conjugate vaccine 20-valent (Completed)       Return in about 6 months (around 04/19/2024) for Diabetes.    Odette Benjamin, NP

## 2023-10-19 NOTE — Patient Instructions (Addendum)
 It was great to see you!  Keep up the great work! Your sugars are well controlled.   I recommend getting a yearly eye exam   Let's follow-up in 3 months, sooner if you have concerns.  If a referral was placed today, you will be contacted for an appointment. Please note that routine referrals can sometimes take up to 3-4 weeks to process. Please call our office if you haven't heard anything after this time frame.  Take care,  Rheba Cedar, NP

## 2023-10-20 ENCOUNTER — Encounter: Payer: Self-pay | Admitting: Nurse Practitioner

## 2024-04-21 ENCOUNTER — Ambulatory Visit: Admitting: Nurse Practitioner

## 2024-04-24 ENCOUNTER — Telehealth: Payer: Self-pay | Admitting: Nurse Practitioner

## 2024-04-24 NOTE — Telephone Encounter (Signed)
 04/24/2024 4:41pm K. Celinda Fortis and spoke with Ms. Whittingham. She was trying to e-check in for her visit with Tinnie Harada, NP 10/17 and e-check in showed $150 copay so she cancelled. I advised that the system doesn't always update the correct copay. She thought appt was NV only. I explained it was an appt with her PCP and she could get 2nd Shingrix  at that time. Pt declined f/u appt with PCP and only wants NV at this time.   Called and Copied from CRM [id number]. Topic: Complaint (DO NOT CONVERT) - Billing/Coding >> Apr 20, 2024  4:46 PM Alfonso ORN wrote: DOS: 04/21/2024 Details of complaint: Pt had to cancel her appt because she was told that she would be charged $150 copay in order to have follow up visit for 2nd shingles shot How would the patient like to see this issue resolved? Pt feels that she should not be charged for 2nd shingles shot and it should be a lab visit   Route to Research officer, political party.

## 2024-04-25 NOTE — Telephone Encounter (Signed)
 Noted

## 2024-05-05 ENCOUNTER — Ambulatory Visit (INDEPENDENT_AMBULATORY_CARE_PROVIDER_SITE_OTHER)

## 2024-05-05 DIAGNOSIS — Z23 Encounter for immunization: Secondary | ICD-10-CM | POA: Diagnosis not present

## 2024-05-05 NOTE — Progress Notes (Signed)
 Patient is in office today for a nurse visit for [employer]. Patient Injection was given in the  Left deltoid. Patient tolerated injection well.
